# Patient Record
Sex: Female | Born: 1965 | Race: White | Hispanic: No | Marital: Married | Smoking: Never smoker
Health system: Southern US, Community
[De-identification: ages and names within clinical notes are randomized; demographics above are authoritative.]

## PROBLEM LIST (undated history)

## (undated) DIAGNOSIS — K219 Gastro-esophageal reflux disease without esophagitis: Secondary | ICD-10-CM

## (undated) DIAGNOSIS — F32A Depression, unspecified: Secondary | ICD-10-CM

## (undated) DIAGNOSIS — F419 Anxiety disorder, unspecified: Secondary | ICD-10-CM

## (undated) DIAGNOSIS — F329 Major depressive disorder, single episode, unspecified: Secondary | ICD-10-CM

## (undated) HISTORY — PX: TUBAL LIGATION: SHX77

---

## 1998-02-10 ENCOUNTER — Inpatient Hospital Stay (HOSPITAL_COMMUNITY): Admission: AD | Admit: 1998-02-10 | Discharge: 1998-02-10 | Payer: Self-pay | Admitting: *Deleted

## 1998-06-07 ENCOUNTER — Other Ambulatory Visit: Admission: RE | Admit: 1998-06-07 | Discharge: 1998-06-07 | Payer: Self-pay | Admitting: *Deleted

## 1998-10-14 ENCOUNTER — Ambulatory Visit (HOSPITAL_COMMUNITY): Admission: RE | Admit: 1998-10-14 | Discharge: 1998-10-14 | Payer: Self-pay | Admitting: Obstetrics and Gynecology

## 1998-11-03 ENCOUNTER — Ambulatory Visit (HOSPITAL_COMMUNITY): Admission: RE | Admit: 1998-11-03 | Discharge: 1998-11-03 | Payer: Self-pay | Admitting: Obstetrics & Gynecology

## 1998-12-30 ENCOUNTER — Inpatient Hospital Stay (HOSPITAL_COMMUNITY): Admission: AD | Admit: 1998-12-30 | Discharge: 1999-01-01 | Payer: Self-pay | Admitting: *Deleted

## 1999-02-04 ENCOUNTER — Other Ambulatory Visit: Admission: RE | Admit: 1999-02-04 | Discharge: 1999-02-04 | Payer: Self-pay | Admitting: *Deleted

## 1999-12-12 ENCOUNTER — Other Ambulatory Visit: Admission: RE | Admit: 1999-12-12 | Discharge: 1999-12-12 | Payer: Self-pay | Admitting: *Deleted

## 2000-04-04 ENCOUNTER — Ambulatory Visit (HOSPITAL_COMMUNITY): Admission: RE | Admit: 2000-04-04 | Discharge: 2000-04-04 | Payer: Self-pay | Admitting: *Deleted

## 2000-05-16 ENCOUNTER — Observation Stay (HOSPITAL_COMMUNITY): Admission: AD | Admit: 2000-05-16 | Discharge: 2000-05-16 | Payer: Self-pay | Admitting: *Deleted

## 2000-06-07 ENCOUNTER — Inpatient Hospital Stay (HOSPITAL_COMMUNITY): Admission: AD | Admit: 2000-06-07 | Discharge: 2000-06-09 | Payer: Self-pay | Admitting: Obstetrics and Gynecology

## 2000-07-16 ENCOUNTER — Other Ambulatory Visit: Admission: RE | Admit: 2000-07-16 | Discharge: 2000-07-16 | Payer: Self-pay | Admitting: *Deleted

## 2000-11-29 ENCOUNTER — Ambulatory Visit (HOSPITAL_COMMUNITY): Admission: RE | Admit: 2000-11-29 | Discharge: 2000-11-29 | Payer: Self-pay | Admitting: Obstetrics and Gynecology

## 2001-10-17 ENCOUNTER — Other Ambulatory Visit: Admission: RE | Admit: 2001-10-17 | Discharge: 2001-10-17 | Payer: Self-pay | Admitting: Obstetrics and Gynecology

## 2002-11-05 ENCOUNTER — Other Ambulatory Visit: Admission: RE | Admit: 2002-11-05 | Discharge: 2002-11-05 | Payer: Self-pay | Admitting: Obstetrics and Gynecology

## 2003-12-31 ENCOUNTER — Other Ambulatory Visit: Admission: RE | Admit: 2003-12-31 | Discharge: 2003-12-31 | Payer: Self-pay | Admitting: Obstetrics and Gynecology

## 2005-01-19 ENCOUNTER — Other Ambulatory Visit: Admission: RE | Admit: 2005-01-19 | Discharge: 2005-01-19 | Payer: Self-pay | Admitting: Obstetrics and Gynecology

## 2005-12-14 ENCOUNTER — Other Ambulatory Visit: Admission: RE | Admit: 2005-12-14 | Discharge: 2005-12-14 | Payer: Self-pay | Admitting: Obstetrics and Gynecology

## 2009-09-18 HISTORY — PX: COLONOSCOPY: SHX174

## 2011-05-29 ENCOUNTER — Encounter (HOSPITAL_COMMUNITY): Payer: Self-pay | Admitting: *Deleted

## 2011-06-01 NOTE — H&P (Signed)
NAME:  Debra Coleman, WERNLI NO.:  [id number]  MEDICAL RECORD NO.:  [id number]  LOCATION:                                 FACILITY:  PHYSICIAN:  Juluis Mire, M.D.   DATE OF BIRTH:  [birth year]  DATE OF ADMISSION: DATE OF DISCHARGE:                             HISTORY & PHYSICAL   The patient is a 45 year old, gravida 3, para 2, abortus 1 female who presents for hysteroscopic resection of an endometrial polyp as well as a midurethral sling with associated cystoscopy.  In relation to present admission, the patient is postmenopausal.  She presented complaining of postmenopausal bleeding.  She did undergo saline infusion ultrasound. Evaluation did reveal a fairly large endometrial polyp.  In view of this, we are going to proceed with hysteroscopic resection of the polyp.  She also complained of stress urinary incontinence and underwent urodynamic testing.  She did leak urine.  She had normal leak-point pressures.  She had normal urethral pressure profiles.  There were no signs of overactive bladder.  She did not have an obstructive voiding pattern.  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  ALLERGIES:  In terms of allergies, no known drug allergies.  MEDICATIONS:  She is on: 1. Lamictal 200 mg a day. 2. Wellbutrin 150 mg a day. 3. Xanax 0.5 mg daily. 4. Valtrex 500 mg daily. 5. On supplements.  PAST MEDICAL HISTORY:  She does have a history of depression, under active management, history of abnormal cervical cytology, history of interstitial cystitis, and irritable bowel syndrome.  PAST SURGICAL HISTORY:  She has had 2 vaginal deliveries and 1 previous laparoscopic bilateral tubal ligation.  FAMILY HISTORY:  The patient is adopted.  SOCIAL HISTORY:  Limited alcohol.  No tobacco use.  REVIEW OF SYSTEMS:   Noncontributory.  PHYSICAL EXAMINATION:  VITAL SIGNS:  The patient is afebrile, stable vital signs. HEENT:  The patient is normocephalic.  Pupils are equal, round, and reactive to light and accommodation.  Extraocular movements are intact. Sclerae and conjunctivae are clear.  Oropharynx is clear. NECK:  Without thyromegaly. BREASTS:  Not examined. LUNGS:  Clear. CARDIOVASCULAR:  Regular rhythm and rate.  There are no murmurs or gallops. ABDOMEN:  Benign.  No mass, organomegaly, or tenderness. PELVIC:  Normal external genitalia.  Vaginal mucosa is clear.  Mild cystourethrocele.  Cervix is unremarkable.  Uterus is normal size, shape, and contour.  Adnexa are free of masses or tenderness. EXTREMITIES:  Trace edema. NEUROLOGIC:  Grossly within normal limits.  IMPRESSION: 1. Postmenopausal bleeding with endometrial polyp. 2. 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  PLAN OF MANAGEMENT:  The patient will undergo hysteroscopy with D and C, we will be using the resectoscope to resect the polyp.  The risks of this procedure have been discussed including the risk of infection.  The risks of hemorrhage could require transfusion with the risks of AIDS or hepatitis.  Excessive bleeding could require hysterectomy.  There is a risk of perforation leading to injury to adjacent organs such as bowel that could require further exploratory surgery.  Risk of deep venous  thrombosis and pulmonary embolus.  For stress incontinence, we will be doing a midurethral sling with a transobturator approach.  Cystoscopy will be performed.  Success rates for this of 80% are quoted.  There is the potential risk of overtightening that could lead to an obstructive voiding pattern.  This could require a second procedure and we have to loosen the sling with the possible return of incontinence.  There is the risk of mesh erosion or rejection requiring further surgical management, erosion can  include the urethra or the bladder.  There is the risk of obturator nerve injury that could lead to chronic leg pain and weakness.  However, with our approach, that is highly unlikely.  There is the risk of development of overactive bladder.  This can lead to the requirement for medications to reduce the leakage associated with this.  Again, the complications include the risk of infection.  The risk of hemorrhage that could require transfusion and the risks of AIDS or hepatitis.  Risk of injury to adjacent organs requiring further exploratory surgery.  Risk of deep venous thrombosis or pulmonary embolus.  Mesh erosions also could lead to painful intercourse.  The patient expressed understanding the indications, risks, and alternatives.     Juluis Mire, M.D.     JSM/MEDQ  D:  06/01/2011  T:  06/01/2011  Job:  [id number]

## 2011-06-01 NOTE — H&P (Signed)
  Patient name  Debra Coleman DICTATION# [id number] CSN# [id number]  Juluis Mire, MD 06/01/2011 10:19 AM

## 2011-06-07 ENCOUNTER — Encounter (HOSPITAL_COMMUNITY): Payer: Self-pay | Admitting: Anesthesiology

## 2011-06-07 ENCOUNTER — Other Ambulatory Visit: Payer: Self-pay | Admitting: Obstetrics and Gynecology

## 2011-06-07 ENCOUNTER — Ambulatory Visit (HOSPITAL_COMMUNITY)
Admission: RE | Admit: 2011-06-07 | Discharge: 2011-06-07 | Disposition: A | Discharge status: Home / Self Care | Payer: Commercial Managed Care - PPO | Source: Ambulatory Visit | Attending: Obstetrics and Gynecology | Admitting: Obstetrics and Gynecology

## 2011-06-07 ENCOUNTER — Ambulatory Visit (HOSPITAL_COMMUNITY): Payer: Commercial Managed Care - PPO | Admitting: Anesthesiology

## 2011-06-07 ENCOUNTER — Encounter (HOSPITAL_COMMUNITY)
Admission: RE | Disposition: A | Discharge status: Home / Self Care | Payer: Self-pay | Source: Ambulatory Visit | Attending: Obstetrics and Gynecology

## 2011-06-07 DIAGNOSIS — N393 Stress incontinence (female) (male): Secondary | ICD-10-CM | POA: Insufficient documentation

## 2011-06-07 DIAGNOSIS — N3641 Hypermobility of urethra: Secondary | ICD-10-CM | POA: Insufficient documentation

## 2011-06-07 DIAGNOSIS — N840 Polyp of corpus uteri: Secondary | ICD-10-CM | POA: Insufficient documentation

## 2011-06-07 DIAGNOSIS — R32 Unspecified urinary incontinence: Secondary | ICD-10-CM

## 2011-06-07 HISTORY — DX: Anxiety disorder, unspecified: F41.9

## 2011-06-07 HISTORY — DX: Major depressive disorder, single episode, unspecified: F32.9

## 2011-06-07 HISTORY — PX: BLADDER SUSPENSION: SHX72

## 2011-06-07 HISTORY — DX: Gastro-esophageal reflux disease without esophagitis: K21.9

## 2011-06-07 HISTORY — DX: Depression, unspecified: F32.A

## 2011-06-07 LAB — CBC
HCT: 40.2 % (ref 36.0–46.0)
Hemoglobin: 13.7 g/dL (ref 12.0–15.0)
MCHC: 34.1 g/dL (ref 30.0–36.0)
WBC: 5.1 10*3/uL (ref 4.0–10.5)

## 2011-06-07 SURGERY — DILATATION & CURETTAGE/HYSTEROSCOPY WITH RESECTOCOPE
Anesthesia: Choice

## 2011-06-07 MED ORDER — PROPOFOL 10 MG/ML IV EMUL
INTRAVENOUS | Status: DC | PRN
  Administered 2011-06-07: 200 mg via INTRAVENOUS

## 2011-06-07 MED ORDER — CEFAZOLIN SODIUM 1-5 GM-% IV SOLN
INTRAVENOUS | Status: AC
  Filled 2011-06-07: qty 50

## 2011-06-07 MED ORDER — FENTANYL CITRATE 0.05 MG/ML IJ SOLN: 25.0000 ug | INTRAMUSCULAR | Status: DC | PRN

## 2011-06-07 MED ORDER — OXYCODONE-ACETAMINOPHEN 5-325 MG PO TABS
1.0000 | ORAL_TABLET | Freq: Once | ORAL | Status: AC
  Administered 2011-06-07: 1 via ORAL

## 2011-06-07 MED ORDER — PROPOFOL 10 MG/ML IV EMUL
INTRAVENOUS | Status: AC
  Filled 2011-06-07: qty 20

## 2011-06-07 MED ORDER — OXYCODONE-ACETAMINOPHEN 5-500 MG PO CAPS: 1.0000 | ORAL_CAPSULE | ORAL | Status: AC | PRN

## 2011-06-07 MED ORDER — KETOROLAC TROMETHAMINE 30 MG/ML IJ SOLN
INTRAMUSCULAR | Status: AC
  Filled 2011-06-07: qty 1

## 2011-06-07 MED ORDER — DEXAMETHASONE SODIUM PHOSPHATE 10 MG/ML IJ SOLN
INTRAMUSCULAR | Status: AC
  Filled 2011-06-07: qty 1

## 2011-06-07 MED ORDER — ALBUTEROL SULFATE HFA 108 (90 BASE) MCG/ACT IN AERS
2.0000 | INHALATION_SPRAY | Freq: Once | RESPIRATORY_TRACT | Status: AC
  Administered 2011-06-07: 2 via RESPIRATORY_TRACT

## 2011-06-07 MED ORDER — LIDOCAINE HCL (CARDIAC) 20 MG/ML IV SOLN
INTRAVENOUS | Status: AC
  Filled 2011-06-07: qty 5

## 2011-06-07 MED ORDER — ONDANSETRON HCL 4 MG/2ML IJ SOLN
INTRAMUSCULAR | Status: AC
  Filled 2011-06-07: qty 2

## 2011-06-07 MED ORDER — INDIGOTINDISULFONATE SODIUM 8 MG/ML IJ SOLN
INTRAMUSCULAR | Status: AC
  Filled 2011-06-07: qty 5

## 2011-06-07 MED ORDER — ALBUTEROL SULFATE HFA 108 (90 BASE) MCG/ACT IN AERS
INHALATION_SPRAY | RESPIRATORY_TRACT | Status: AC
  Administered 2011-06-07: 2 via RESPIRATORY_TRACT
  Filled 2011-06-07: qty 6.7

## 2011-06-07 MED ORDER — KETOROLAC TROMETHAMINE 30 MG/ML IJ SOLN
INTRAMUSCULAR | Status: DC | PRN
  Administered 2011-06-07: 30 mg via INTRAVENOUS

## 2011-06-07 MED ORDER — LIDOCAINE HCL (CARDIAC) 20 MG/ML IV SOLN
INTRAVENOUS | Status: DC | PRN
  Administered 2011-06-07: 40 mg via INTRAVENOUS

## 2011-06-07 MED ORDER — FENTANYL CITRATE 0.05 MG/ML IJ SOLN
INTRAMUSCULAR | Status: DC | PRN
  Administered 2011-06-07: 100 ug via INTRAVENOUS

## 2011-06-07 MED ORDER — CEFAZOLIN SODIUM 1-5 GM-% IV SOLN
1.0000 g | INTRAVENOUS | Status: AC
  Administered 2011-06-07: 1 g via INTRAVENOUS

## 2011-06-07 MED ORDER — MIDAZOLAM HCL 2 MG/2ML IJ SOLN
INTRAMUSCULAR | Status: AC
  Filled 2011-06-07: qty 2

## 2011-06-07 MED ORDER — INDIGOTINDISULFONATE SODIUM 8 MG/ML IJ SOLN
INTRAMUSCULAR | Status: DC | PRN
  Administered 2011-06-07: 5 mL via INTRAVENOUS

## 2011-06-07 MED ORDER — OXYCODONE-ACETAMINOPHEN 5-325 MG PO TABS
ORAL_TABLET | ORAL | Status: AC
  Administered 2011-06-07: 1 via ORAL
  Filled 2011-06-07: qty 1

## 2011-06-07 MED ORDER — LACTATED RINGERS IV SOLN
INTRAVENOUS | Status: DC
  Administered 2011-06-07: 125 mL/h via INTRAVENOUS
  Administered 2011-06-07: 11:00:00 via INTRAVENOUS

## 2011-06-07 MED ORDER — LIDOCAINE-EPINEPHRINE (PF) 1 %-1:200000 IJ SOLN
INTRAMUSCULAR | Status: DC | PRN
  Administered 2011-06-07: 23 mL

## 2011-06-07 MED ORDER — MIDAZOLAM HCL 5 MG/5ML IJ SOLN
INTRAMUSCULAR | Status: DC | PRN
  Administered 2011-06-07: 2 mg via INTRAVENOUS

## 2011-06-07 MED ORDER — DEXAMETHASONE SODIUM PHOSPHATE 10 MG/ML IJ SOLN
INTRAMUSCULAR | Status: DC | PRN
  Administered 2011-06-07: 10 mg via INTRAVENOUS

## 2011-06-07 MED ORDER — FENTANYL CITRATE 0.05 MG/ML IJ SOLN
INTRAMUSCULAR | Status: AC
  Filled 2011-06-07: qty 5

## 2011-06-07 MED ORDER — GLYCINE 1.5 % IR SOLN
Status: DC | PRN
  Administered 2011-06-07: 3000 mL

## 2011-06-07 MED ORDER — ONDANSETRON HCL 4 MG/2ML IJ SOLN
INTRAMUSCULAR | Status: DC | PRN
  Administered 2011-06-07: 4 mg via INTRAVENOUS

## 2011-06-07 SURGICAL SUPPLY — 29 items
BANDAGE GAUZE ELAST BULKY 4 IN (GAUZE/BANDAGES/DRESSINGS) IMPLANT
BLADE SURG 15 STRL LF C SS BP (BLADE) ×1 IMPLANT
BLADE SURG 15 STRL SS (BLADE) ×2
CANISTER SUCTION 2500CC (MISCELLANEOUS) ×2 IMPLANT
CATH ROBINSON RED A/P 16FR (CATHETERS) ×2 IMPLANT
CLOTH BEACON ORANGE TIMEOUT ST (SAFETY) ×2 IMPLANT
CONTAINER PREFILL 10% NBF 60ML (FORM) ×4 IMPLANT
DERMABOND ADVANCED (GAUZE/BANDAGES/DRESSINGS) ×2 IMPLANT
DRAPE CAMERA CLOSED 9X96 (DRAPES) ×2 IMPLANT
DRAPE HYSTEROSCOPY (DRAPE) ×2 IMPLANT
DRAPE UTILITY XL STRL (DRAPES) ×4 IMPLANT
ELECT REM PT RETURN 9FT ADLT (ELECTROSURGICAL) ×2
ELECTRODE REM PT RTRN 9FT ADLT (ELECTROSURGICAL) ×1 IMPLANT
GLOVE BIO SURGEON STRL SZ7 (GLOVE) ×4 IMPLANT
GOWN PREVENTION PLUS LG XLONG (DISPOSABLE) ×2 IMPLANT
GOWN STRL REIN XL XLG (GOWN DISPOSABLE) ×2 IMPLANT
LOOP ANGLED CUTTING 22FR (CUTTING LOOP) ×1 IMPLANT
NDL SPNL 20GX3.5 QUINCKE YW (NEEDLE) ×1 IMPLANT
NEEDLE SPNL 20GX3.5 QUINCKE YW (NEEDLE) ×2 IMPLANT
NS IRRIG 1000ML POUR BTL (IV SOLUTION) ×2 IMPLANT
PACK HYSTEROSCOPY LF (CUSTOM PROCEDURE TRAY) ×2 IMPLANT
PACK VAGINAL WOMENS (CUSTOM PROCEDURE TRAY) ×2 IMPLANT
SET CYSTO W/LG BORE CLAMP LF (SET/KITS/TRAYS/PACK) ×2 IMPLANT
SLING HALO OBTRYX (Sling) ×1 IMPLANT
SLING LYNX SUPRAPUBIC BX (SLING) IMPLANT
SUT VIC AB 2-0 UR6 27 (SUTURE) ×4 IMPLANT
TOWEL OR 17X24 6PK STRL BLUE (TOWEL DISPOSABLE) ×4 IMPLANT
TRAY FOLEY CATH 14FR (SET/KITS/TRAYS/PACK) ×2 IMPLANT
WATER STERILE IRR 1000ML POUR (IV SOLUTION) ×2 IMPLANT

## 2011-06-07 NOTE — Op Note (Signed)
Debra Coleman, KIDNEY              ACCOUNT NO.:  [id number]  MEDICAL RECORD NO.:  [id number]  LOCATION:  [facility]                          FACILITY:  [facility]  PHYSICIAN:  Juluis Mire, M.D.   DATE OF BIRTH:  1966-07-16  DATE OF PROCEDURE:  06/07/2011 DATE OF DISCHARGE:  06/07/2011                              OPERATIVE REPORT   PREOPERATIVE DIAGNOSES:  Endometrial polyp.  Anatomical stress urinary incontinence due to urethral hypermobility.  PROCEDURES:  Hysteroscopy, resection of endometrial polyp.  Uterine curettings.  Mid urethral sling using a transobturator approach. Cystoscopy.  SURGEON:  Juluis Mire, MD  ANESTHESIA:  General.  ESTIMATED BLOOD LOSS:  200 mL.  PACKS/DRAINS:  None.  INTRAOPERATIVE BLOOD PLACEMENT:  None.  COMPLICATIONS:  None.  INDICATIONS:  Dictated in history and physical.  PROCEDURE IN DETAIL:  The patient was taken to the OR and placed in the supine position.  After satisfactory level of general anesthesia was obtained, the patient was placed in the dorsal lithotomy position using the Allen stirrups.  The perineum and vagina prepped out with Betadine. The patient was then draped as a sterile field.  Speculum was placed in the vaginal vault.  The cervix was grasped with single-tooth tenaculum. A paracervical block of 1% Xylocaine with epinephrine was then instituted.  Uterus sounded to 9 cm.  Cervix was serially dilated with 31 Pratt dilator.  Operative hysteroscope was introduced.  Intrauterine cavity was distended using glycerol.  Visualization revealed an anterior wall endometrial polyp in the lower uterine segment.  This was resected completely and was sent for pathological review.  Once the resection was completed, we obtained endometrial curettings.  Total deficit was 250 mL.  There was no active bleeding or signs of perforation.  At this point in time, the Foley was placed into the bladder and the bladder was drained.  The Foley was clamped  off with a [name].  Mid urethral area was identified, infiltrated with 1% Xylocaine with epinephrine.  Incision was made in the vaginal mucosa over the mid urethral area.  Using blunt and sharp dissection, we dissected out laterally on each side to the obturator foramen.  A point on the groin below the abductus longus muscle, lateral to the inferior pubic ramus, about the level of clitoris was identified and infiltrated with 1% Xylocaine.  An incision was made.  Using the halo transobturator approach, both needles were passed through the skin, through the obturator foramen, around the inferior pubic ramus, and out through the vaginal incision.  There was no evidence of buttonholing of the vaginal mucosa.  Cystoscopy was performed.  There was no evidence of injury to the bladder or urethra.  The patient had been given indigo carmine. Blue-tinged urine was noted to be spilling from both ureteral orifices. Cystoscope was then removed.  The polypropylene mesh was placed on each needle and brought out through the skin.  The plastic tab was clipped. We adjusted the mesh under the mid urethral area until it lay flat but we could put a [name] and rotate at 90 degrees, so it was still loose. The plastic arms were swept off as we held the [name] in place.  We  still had good placement of the sling.  The arms of the sling were trimmed to the level of the skin.  The vaginal incision was closed with a running locking suture of 2-0 Vicryl.  Skin was closed with Dermabond.  Foley was placed to straight drain.  The patient had some rectal bleeding.  We did a rectal exam.  It looked like she had some fissures or internal hemorrhoids anteriorly.  At this point in time, the patient was taken out of the dorsal lithotomy position.  Once alert and extubated, transferred to recovery room in good condition.  Sponge, instrument, and needle count was correct by the circulating nurse x2.     Juluis Mire,  M.D.     JSM/MEDQ  D:  06/07/2011  T:  06/07/2011  Job:  [id number]

## 2011-06-07 NOTE — Transfer of Care (Signed)
Immediate Anesthesia Transfer of Care Note  Patient: Debra Coleman  Procedure(s) Performed:  DILATATION & CURETTAGE/HYSTEROSCOPY WITH RESECTOSCOPE; TRANSVAGINAL TAPE (TVT) PROCEDURE - transobturator sling  Patient Location: PACU  Anesthesia Type: General  Level of Consciousness: awake, alert , oriented and patient cooperative  Airway & Oxygen Therapy: Patient Spontanous Breathing and Patient connected to nasal cannula oxygen  Post-op Assessment: Report given to PACU RN and Post -op Vital signs reviewed and stable  Post vital signs: Reviewed and stable  Complications: No apparent anesthesia complications

## 2011-06-07 NOTE — Progress Notes (Signed)
Bladder instilled with of irrigation pt tolerated well.  Pt up to rest room to attempt to void.

## 2011-06-07 NOTE — Progress Notes (Signed)
Pt continues to complain of not being able to take a deep breath.  Lungs clear bilaterally.  No signs of respiratory distress.  Dr. Sheral Apley notified he assessed pt at bedside new orders received will carry out and continue to monitor.

## 2011-06-07 NOTE — Progress Notes (Signed)
Pt states that she feels like she can't take a deep breath.  Pt states that she felt the same way prior to surgery.  Pt denies any feeling of need to cough.  No resp distress is noted.  Pt is able to speak complete sentences.  RN has encouraged pt to change position and to reassess pt.  No wheezing noted at this time.

## 2011-06-07 NOTE — Brief Op Note (Signed)
06/07/2011  11:33 AM  PATIENT:  Debra Coleman  45 y.o. female  PRE-OPERATIVE DIAGNOSIS:  polyp, stress urinary incontinence  POST-OPERATIVE DIAGNOSIS:  polyp, stress urinary incontinence  PROCEDURE:  Procedure(s): DILATATION & CURETTAGE/HYSTEROSCOPY WITH RESECTOSCOPE TRANSVAGINAL TAPE (TVT) PROCEDURE  SURGEON:  Surgeon(s): Alontae Chaloux S Amalia Edgecombe  PHYSICIAN ASSISTANT:   ASSISTANTS: none   ANESTHESIA:   general  OR FLUID I/O:  Total I/O In: 1505 [I.V.:1505] Out: 100 [Blood:100]  BLOOD ADMINISTERED:none  DRAINS: Urinary Catheter (Foley)   LOCAL MEDICATIONS USED:  XYLOCAINE 28 CC  SPECIMEN:  Source of Specimen:  polyp and curretting  DISPOSITION OF SPECIMEN:  PATHOLOGY  COUNTS:  YES  TOURNIQUET:  * No tourniquets in log *  DICTATION: .Other Dictation: Dictation Number [id number]  PLAN OF CARE: Discharge to home after PACU  PATIENT DISPOSITION:  PACU - hemodynamically stable.   Delay start of Pharmacological VTE agent (>24hrs) due to surgical blood loss or risk of bleeding:  yes

## 2011-06-07 NOTE — Anesthesia Preprocedure Evaluation (Addendum)
Anesthesia Evaluation  Name, MR# and DOB Patient awake  General Assessment Comment  Reviewed: Allergy & Precautions, H&P , NPO status , Patient's Chart, lab work & pertinent test results  Airway Mallampati: I TM Distance: >3 FB Neck ROM: full    Dental  (+) Teeth Intact   Pulmonary  clear to auscultation  pulmonary exam normalPulmonary Exam Normal breath sounds clear to auscultation none    Cardiovascular     Neuro/Psych    (+) Anxiety, Depression,  Negative Neurological ROS    GI/Hepatic/Renal   negative Liver ROS  negative Renal ROS   GERD Controlled     Endo/Other  Negative Endocrine ROS (+)      Abdominal Normal abdominal exam  (+)   Musculoskeletal   Hematology negative hematology ROS (+)   Peds  Reproductive/Obstetrics negative OB ROS    Anesthesia Other Findings             Anesthesia Physical Anesthesia Plan  ASA: II  Anesthesia Plan: General   Post-op Pain Management:    Induction:   Airway Management Planned: LMA  Additional Equipment:   Intra-op Plan:   Post-operative Plan:   Informed Consent: I have reviewed the patients History and Physical, chart, labs and discussed the procedure including the risks, benefits and alternatives for the proposed anesthesia with the patient or authorized representative who has indicated his/her understanding and acceptance.     Plan Discussed with: CRNA  Anesthesia Plan Comments:         Anesthesia Quick Evaluation

## 2011-06-07 NOTE — Anesthesia Postprocedure Evaluation (Signed)
Anesthesia Post Note  Patient: Debra Coleman  Procedure(s) Performed:  DILATATION & CURETTAGE/HYSTEROSCOPY WITH RESECTOSCOPE; TRANSVAGINAL TAPE (TVT) PROCEDURE - transobturator sling  Anesthesia type: GA  Patient location: PACU  Post pain: Pain level controlled  Post assessment: Post-op Vital signs reviewed  Last Vitals:  Filed Vitals:   06/07/11 1215  BP: 109/66  Pulse: 71  Temp:   Resp: 12    Post vital signs: Reviewed  Level of consciousness: sedated  Complications: No apparent anesthesia complications

## 2011-06-07 NOTE — H&P (Signed)
Hand P unchanged

## 2011-06-07 NOTE — Progress Notes (Signed)
  Patient name  Careli Luzader DICTATION#  [id number] CSN#  [id number]  Juluis Mire, MD 06/07/2011 11:36 AM

## 2011-06-15 ENCOUNTER — Encounter (HOSPITAL_COMMUNITY): Payer: Self-pay | Admitting: Obstetrics and Gynecology

## 2015-06-29 ENCOUNTER — Other Ambulatory Visit: Payer: Self-pay | Admitting: Nurse Practitioner

## 2015-06-29 DIAGNOSIS — N6002 Solitary cyst of left breast: Secondary | ICD-10-CM

## 2015-07-15 ENCOUNTER — Ambulatory Visit
Admission: RE | Admit: 2015-07-15 | Discharge: 2015-07-15 | Disposition: A | Discharge status: Home / Self Care | Payer: Commercial Managed Care - PPO | Source: Ambulatory Visit | Attending: Nurse Practitioner | Admitting: Nurse Practitioner

## 2015-07-15 ENCOUNTER — Other Ambulatory Visit: Payer: Self-pay

## 2015-07-15 ENCOUNTER — Other Ambulatory Visit: Payer: Self-pay | Admitting: Nurse Practitioner

## 2015-07-15 DIAGNOSIS — N6002 Solitary cyst of left breast: Secondary | ICD-10-CM

## 2015-07-15 DIAGNOSIS — N6320 Unspecified lump in the left breast, unspecified quadrant: Secondary | ICD-10-CM

## 2015-07-20 ENCOUNTER — Ambulatory Visit
Admission: RE | Admit: 2015-07-20 | Discharge: 2015-07-20 | Disposition: A | Discharge status: Home / Self Care | Payer: Commercial Managed Care - PPO | Source: Ambulatory Visit | Attending: Nurse Practitioner | Admitting: Nurse Practitioner

## 2015-07-20 ENCOUNTER — Other Ambulatory Visit: Payer: Self-pay | Admitting: Nurse Practitioner

## 2015-07-20 DIAGNOSIS — N6320 Unspecified lump in the left breast, unspecified quadrant: Secondary | ICD-10-CM

## 2015-07-20 HISTORY — PX: BREAST BIOPSY: SHX20

## 2016-11-22 IMAGING — US US LT BREAST BX
1 series · 13 of 16 positions shown · non-contrast
Comparison: Previous exam(s).

CLINICAL DATA: 49-year-old female presenting for evaluation of
palpable areas of concern in the left breast. The patient noticed a
tender palpable area in the lateral left breast. The patient's
physician noted an area of thickening in the superior left breast.

EXAM:
DIGITAL DIAGNOSTIC BILATERAL MAMMOGRAM WITH 3D TOMOSYNTHESIS AND CAD
LEFT BREAST ULTRASOUND

[Series 1: us left breast bx · 0.07mm/px · 13 of 16 slices shown]
[im 1/16]
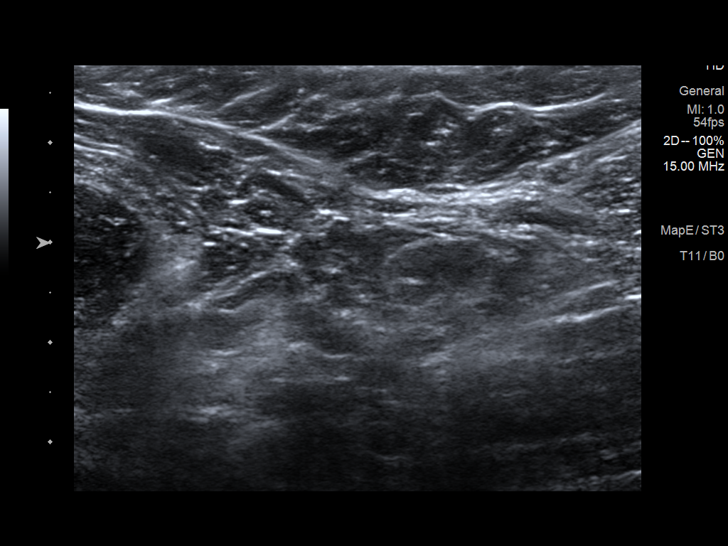
[im 2/16]
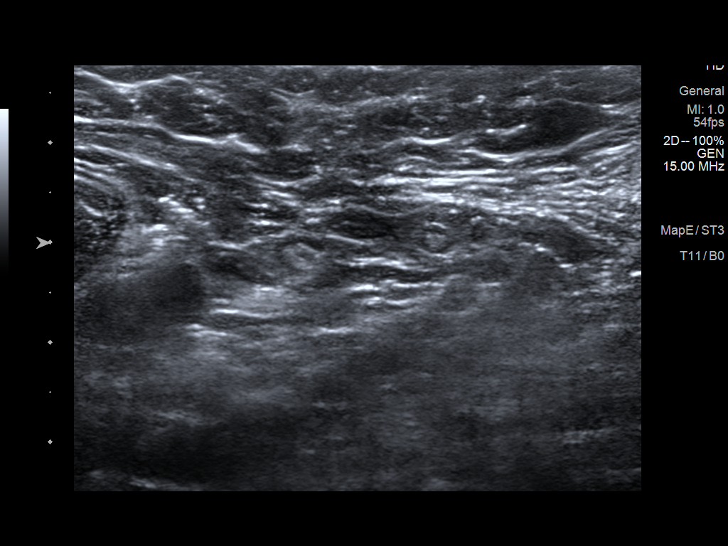
[im 4/16]
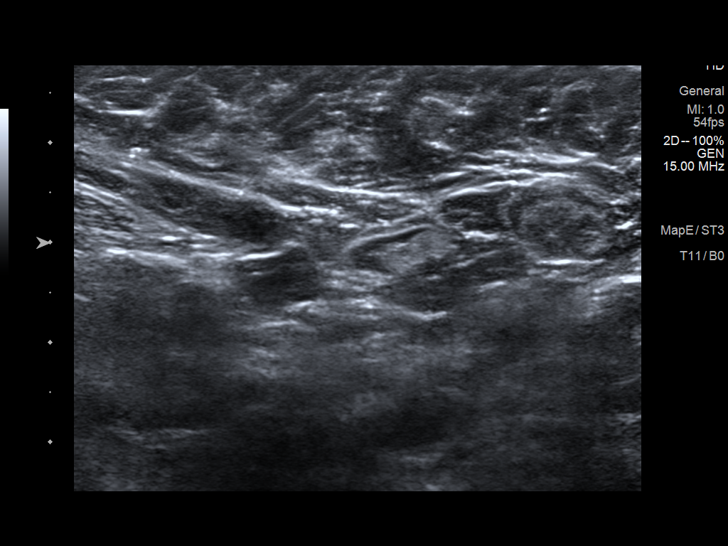
[im 5/16]
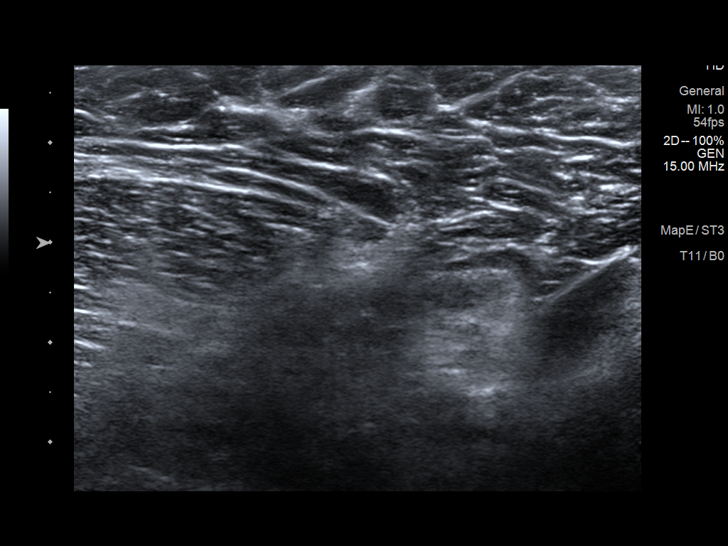
[im 6/16]
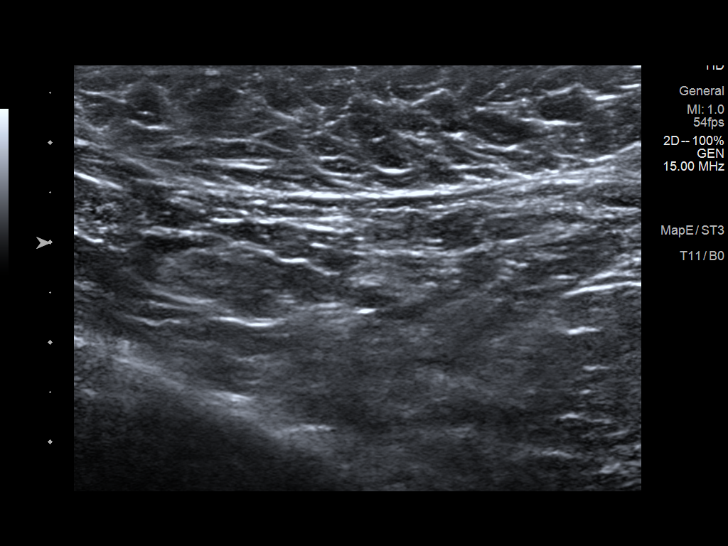
[im 7/16]
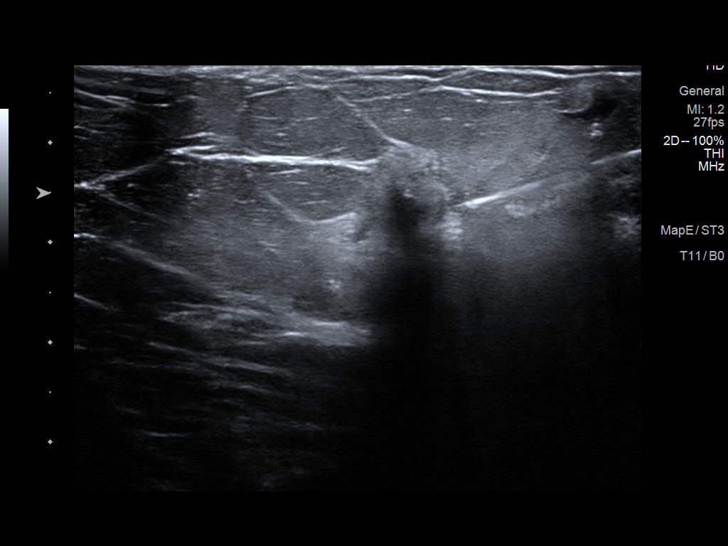
[im 9/16]
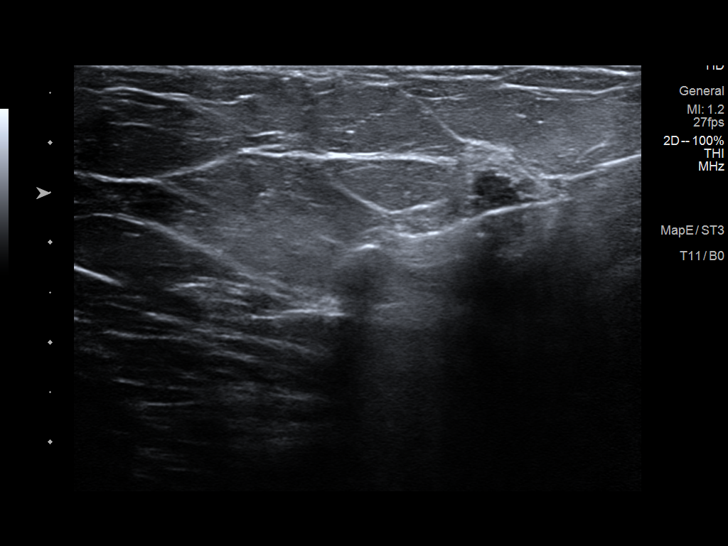
[im 10/16]
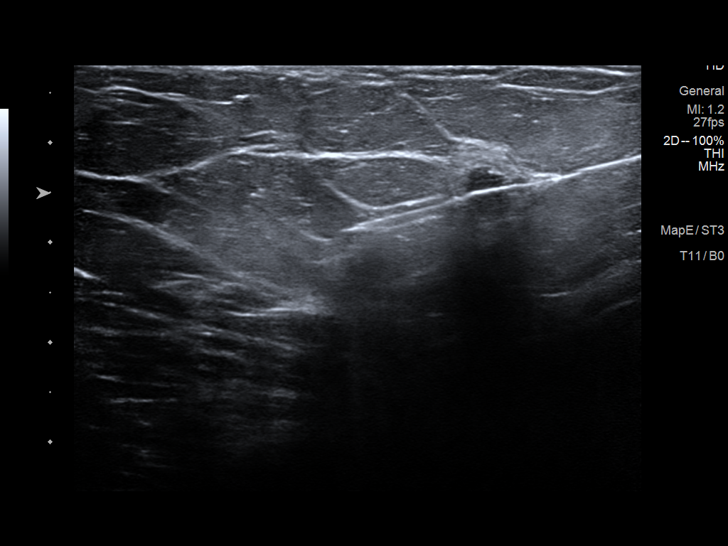
[im 11/16]
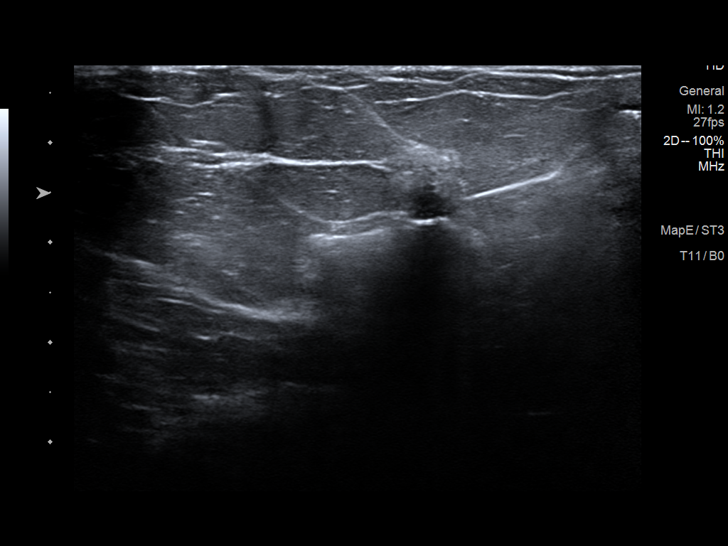
[im 12/16]
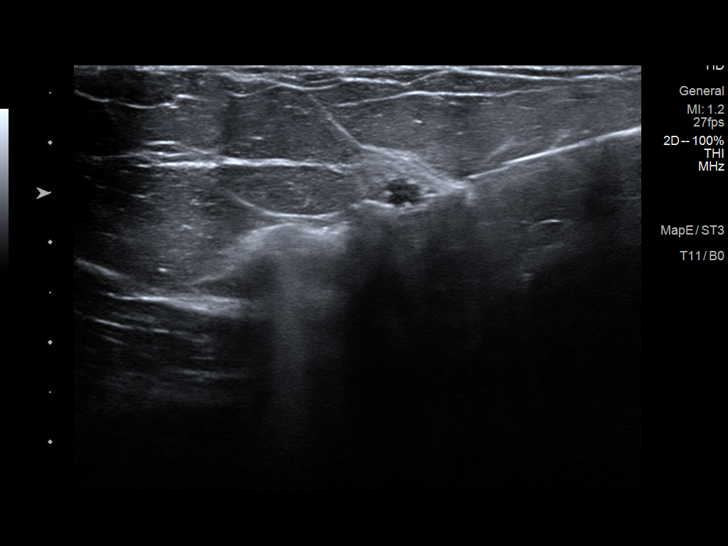
[im 13/16]
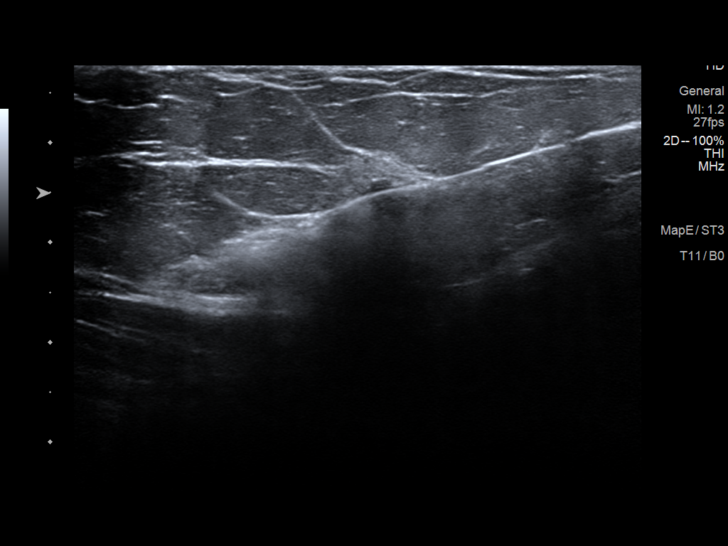
[im 15/16]
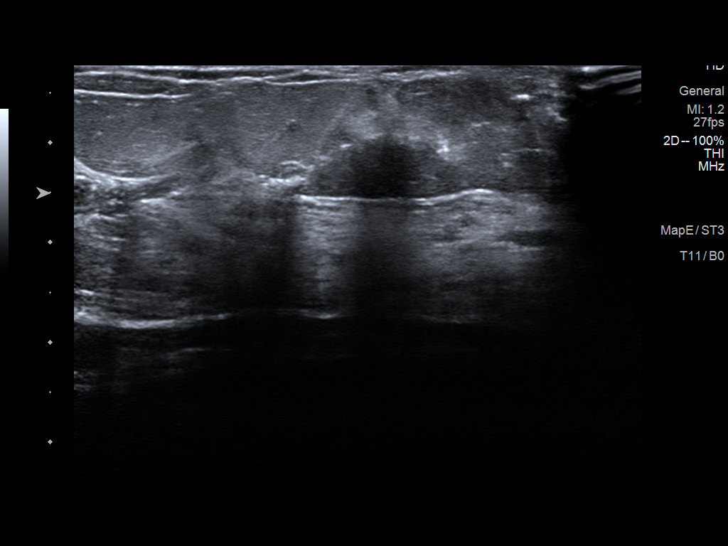
[im 16/16]
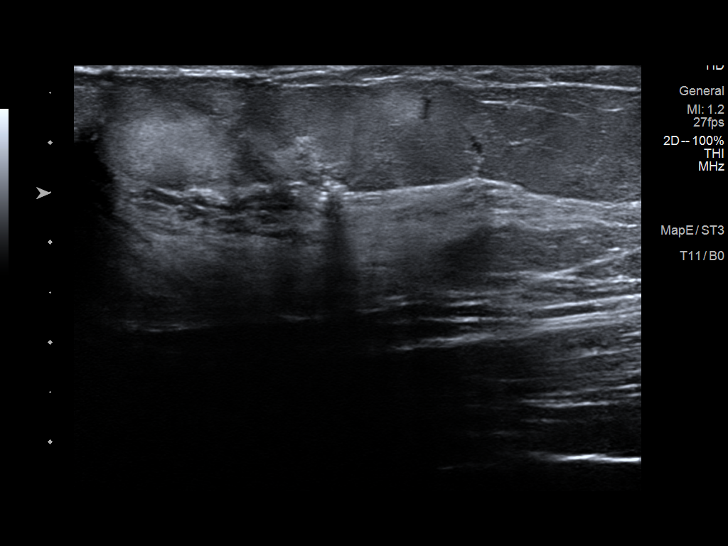

[13 of 16 positions shown; findings below may reference images not displayed]

ACR Breast Density Category c: The breast tissue is heterogeneously
dense, which may obscure small masses.
FINDINGS: A palpable marker has been placed on the lateral aspect of the left
breast. Dense breast tissue is seen deep to the palpable marker
without definite discrete mass. In the central left breast, middle
to anterior depth, there is a 0.9 cm irregular mass.

Mammographic images were processed with CAD.

Physical exam of the superior lateral left breast demonstrates no
definite discrete palpable masses. Dense fibroglandular tissue is
palpated.

Ultrasound targeted to the upper-outer quadrant of left breast
demonstrates scattered areas of fibrocystic change. In the left
breast at 12 o'clock, 1 cm from the nipple there is a hypoechoic
irregular mass with macrolobulations measuring 1.1 x 0.6 x 0.4 cm.
This corresponds with the mass identified on mammography.
IMPRESSION: 1. Indeterminate 1.1 cm irregular mass at 12 o'clock, 1 cm from the
nipple.

2. No mammographic or targeted sonographic correlate for the
palpable area in the lateral left breast.

RECOMMENDATION:
1. Ultrasound-guided biopsy is recommended for the 1.1 cm mass in
the left breast at 12 o'clock. This has been scheduled for
07/20/2015 at 9 a.m..

2. No mammographic or targeted sonographic correlate for the
palpable area of concern in the lateral left breast. Any further
workup of this palpable area should be based on clinical assessment.

I have discussed the findings and recommendations with the patient.
Results were also provided in writing at the conclusion of the
visit. If applicable, a reminder letter will be sent to the patient
regarding the next appointment.

BI-RADS CATEGORY  4: Suspicious.

## 2017-12-13 ENCOUNTER — Other Ambulatory Visit: Payer: Self-pay | Admitting: Radiology

## 2017-12-13 DIAGNOSIS — N644 Mastodynia: Secondary | ICD-10-CM

## 2017-12-18 ENCOUNTER — Other Ambulatory Visit: Payer: Commercial Managed Care - PPO

## 2017-12-25 ENCOUNTER — Ambulatory Visit
Admission: RE | Admit: 2017-12-25 | Discharge: 2017-12-25 | Disposition: A | Discharge status: Home / Self Care | Payer: PRIVATE HEALTH INSURANCE | Source: Ambulatory Visit | Attending: Radiology | Admitting: Radiology

## 2017-12-25 ENCOUNTER — Ambulatory Visit
Admission: RE | Admit: 2017-12-25 | Discharge: 2017-12-25 | Disposition: A | Discharge status: Home / Self Care | Payer: Commercial Managed Care - PPO | Source: Ambulatory Visit | Attending: Radiology | Admitting: Radiology

## 2017-12-25 DIAGNOSIS — N644 Mastodynia: Secondary | ICD-10-CM

## 2018-07-17 ENCOUNTER — Other Ambulatory Visit: Payer: Self-pay

## 2018-07-17 MED ORDER — LAMOTRIGINE 200 MG PO TABS: 200.0000 mg | ORAL_TABLET | Freq: Every day | ORAL | 1 refills | Status: DC

## 2018-09-24 ENCOUNTER — Telehealth: Payer: Self-pay

## 2018-09-24 MED ORDER — BUPROPION HCL ER (XL) 150 MG PO TB24: 150.0000 mg | ORAL_TABLET | Freq: Every day | ORAL | 0 refills | Status: DC

## 2018-09-24 NOTE — Telephone Encounter (Signed)
Refill request from [facility] for Buproprion XL 150 mg, Next OV 11/05/2018 Last seen 05/2018

## 2018-09-25 ENCOUNTER — Other Ambulatory Visit: Payer: Self-pay

## 2018-09-25 ENCOUNTER — Encounter: Payer: Self-pay | Admitting: Psychiatry

## 2018-09-25 ENCOUNTER — Ambulatory Visit (INDEPENDENT_AMBULATORY_CARE_PROVIDER_SITE_OTHER): Payer: No Typology Code available for payment source | Admitting: Psychiatry

## 2018-09-25 DIAGNOSIS — R7989 Other specified abnormal findings of blood chemistry: Secondary | ICD-10-CM

## 2018-09-25 DIAGNOSIS — F4001 Agoraphobia with panic disorder: Secondary | ICD-10-CM | POA: Diagnosis not present

## 2018-09-25 DIAGNOSIS — F5105 Insomnia due to other mental disorder: Secondary | ICD-10-CM

## 2018-09-25 DIAGNOSIS — F411 Generalized anxiety disorder: Secondary | ICD-10-CM | POA: Diagnosis not present

## 2018-09-25 DIAGNOSIS — F3181 Bipolar II disorder: Secondary | ICD-10-CM

## 2018-09-25 MED ORDER — ALPRAZOLAM 0.25 MG PO TABS: ORAL_TABLET | ORAL | 4 refills | Status: DC

## 2018-09-25 MED ORDER — ALPRAZOLAM 0.25 MG PO TABS: ORAL_TABLET | ORAL | 1 refills | Status: DC

## 2018-09-25 NOTE — Progress Notes (Signed)
Debra Coleman [id number] 1966/08/15 54 y.o.  Subjective:   Patient ID:  Debra Coleman is a 53 y.o. (DOB [birth year]) female.  Chief Complaint:  Chief Complaint  Patient presents with  . Anxiety    Increasing this past week    HPI  Last seen 05/21/18.  Today urgent appt. Debra Coleman presents to the office today for follow-up of mood disorder and worsening anxiety.  Went to ER [place] yesterday with panic.  Workup was normal.  Normally take the Xanax 0.25 HS.  Stress F dementia and mother stressed and sleep problems.  Gave M 5 Xanax and then patient ran out last Friday.  Also work is still stressful.  No Xanax on Friday and didn't sleep.  Sunday evening mother got Xanax from her PCP.  Patient took different manufacturer of Xanax 0.25 and couldn't sleep last 2 nights with EMA.  In 3 days got 12 hours sleep and real stressful work Monday.  Very sensitive to sleep deprivation.  Can't think clearly.  Last night had CP and feared MI.  Went to ER.  Missed work today.  Mild seasonal depression for 3-4 weeks with increased sleep need and down, less motivated and eating more.  Been consistent with other meds.  Is taking propranolol.   Review of Systems:  Review of Systems  Neurological: Negative for tremors and weakness.  Psychiatric/Behavioral: Positive for dysphoric mood and sleep disturbance. Negative for agitation, behavioral problems, confusion, decreased concentration, hallucinations, self-injury and suicidal ideas. The patient is nervous/anxious. The patient is not hyperactive.     Medications: I have reviewed the patient's current medications.  Current Outpatient Medications  Medication Sig Dispense Refill  . Acetylcysteine (NAC PO) Take 1 tablet by mouth daily.      Marland Kitchen ALPRAZolam (XANAX) 0.25 MG tablet Take 1/2 tablet by mouth at bedtime and 1 tablet every day as needed for anxiety 45 tablet 4  . B Complex-Folic Acid (B COMPLEX-VITAMIN B12 PO) Take 1 tablet by mouth daily.       Marland Kitchen buPROPion (WELLBUTRIN XL) 150 MG 24 hr tablet Take 1 tablet (150 mg total) by mouth daily. 90 tablet 0  . Cholecalciferol (VITAMIN D-3 PO) Take 2 tablets by mouth daily.     Marland Kitchen lamoTRIgine (LAMICTAL) 200 MG tablet Take 1 tablet (200 mg total) by mouth daily. 90 tablet 1  . propranolol (INDERAL) 20 MG tablet Take 40 mg by mouth daily.    . valACYclovir (VALTREX) 500 MG tablet Take 500 mg by mouth 2 (two) times daily.     No current facility-administered medications for this visit.     Medication Side Effects: None  Allergies: No Known Allergies  Past Medical History:  Diagnosis Date  . Anxiety   . Depression    takes wellbutrin, lamictal  . GERD (gastroesophageal reflux disease)    no meds    Family History  Adopted: Yes    Social History   Socioeconomic History  . Marital status: Married    Spouse name: Not on file  . Number of children: Not on file  . Years of education: Not on file  . Highest education level: Not on file  Occupational History  . Not on file  Social Needs  . Financial resource strain: Not on file  . Food insecurity:    Worry: Not on file    Inability: Not on file  . Transportation needs:    Medical: Not on file    Non-medical: Not on file  Tobacco Use  . Smoking status: Never Smoker  . Smokeless tobacco: Never Used  Substance and Sexual Activity  . Alcohol use: Yes  . Drug use: No  . Sexual activity: Yes  Lifestyle  . Physical activity:    Days per week: Not on file    Minutes per session: Not on file  . Stress: Not on file  Relationships  . Social connections:    Talks on phone: Not on file    Gets together: Not on file    Attends religious service: Not on file    Active member of club or organization: Not on file    Attends meetings of clubs or organizations: Not on file    Relationship status: Not on file  . Intimate partner violence:    Fear of current or ex partner: Not on file    Emotionally abused: Not on file     Physically abused: Not on file    Forced sexual activity: Not on file  Other Topics Concern  . Not on file  Social History Narrative  . Not on file    Past Medical History, Surgical history, Social history, and Family history were reviewed and updated as appropriate.   Please see review of systems for further details on the patient's review from today.   Objective:   Physical Exam:  LMP 02/26/2011   Physical Exam Constitutional:      General: She is not in acute distress.    Appearance: Normal appearance. She is well-developed.  Musculoskeletal:        General: No deformity.  Neurological:     Mental Status: She is alert and oriented to person, place, and time.     Motor: No tremor.     Coordination: Coordination normal.     Gait: Gait normal.  Psychiatric:        Attention and Perception: Attention normal.        Mood and Affect: Mood is anxious and depressed. Affect is not labile, blunt, angry or inappropriate.        Speech: Speech normal.        Behavior: Behavior normal.        Thought Content: Thought content normal. Thought content does not include homicidal or suicidal ideation. Thought content does not include homicidal or suicidal plan.        Cognition and Memory: Cognition normal.        Judgment: Judgment normal.     Comments: Insight intact. No auditory or visual hallucinations. No delusions.      Lab Review:  No results found for: NA, K, CL, CO2, GLUCOSE, BUN, CREATININE, CALCIUM, PROT, ALBUMIN, AST, ALT, ALKPHOS, BILITOT, GFRNONAA, GFRAA     Component Value Date/Time   WBC 5.1 06/07/2011 0918   RBC 4.22 06/07/2011 0918   HGB 13.7 06/07/2011 0918   HCT 40.2 06/07/2011 0918   PLT 173 06/07/2011 0918   MCV 95.3 06/07/2011 0918   MCH 32.5 06/07/2011 0918   MCHC 34.1 06/07/2011 0918   RDW 12.8 06/07/2011 0918    No results found for: POCLITH, LITHIUM   No results found for: PHENYTOIN, PHENOBARB, VALPROATE, CBMZ   .res Assessment: Plan:     Panic disorder with agoraphobia  Generalized anxiety disorder  Bipolar 2 disorder (HCC)  Low vitamin D level   We discussed the short-term risks associated with benzodiazepines including sedation and increased fall risk among others.  Discussed long-term side effect risk including dependence, potential withdrawal symptoms, and the  potential eventual dose-related risk of dementia. Take Xanax 0.375 mg HS for a few days then drop back to 0.25 when sleep pattern is better.  Get [place] the name of the effective and the ineffective generic manufacturer.  Take vitamin D increase to 5000 U daily through the winter off label for seasonal depression  Consider increasing the Wellbutrin later or other med changes.  Educated re Panic disorder.  This appt was 30 mins.  FU 2 mos  Meredith Staggers, MD, DFAPA   Please see After Visit Summary for patient specific instructions.  Future Appointments  Date Time Provider Department Center  11/05/2018  4:30 PM Cottle, Steva Ready., MD [facility] None    No orders of the defined types were placed in this encounter.     -------------------------------

## 2018-09-25 NOTE — Patient Instructions (Signed)
Get [place] the name of the effective and the ineffective generic manufacturer.

## 2018-11-05 ENCOUNTER — Ambulatory Visit: Payer: Self-pay | Admitting: Psychiatry

## 2018-12-31 ENCOUNTER — Other Ambulatory Visit: Payer: Self-pay

## 2018-12-31 MED ORDER — PROPRANOLOL HCL 20 MG PO TABS: 40.0000 mg | ORAL_TABLET | Freq: Every day | ORAL | 0 refills | Status: DC

## 2019-01-09 ENCOUNTER — Other Ambulatory Visit: Payer: Self-pay

## 2019-01-09 MED ORDER — LAMOTRIGINE 200 MG PO TABS: 200.0000 mg | ORAL_TABLET | Freq: Every day | ORAL | 1 refills | Status: DC

## 2019-02-19 ENCOUNTER — Other Ambulatory Visit: Payer: Self-pay | Admitting: Psychiatry

## 2019-02-20 NOTE — Telephone Encounter (Signed)
Not been seen since 09/2018 due back in Feb and cxl. Front office to call to set up appt.

## 2019-03-10 ENCOUNTER — Encounter: Payer: Self-pay | Admitting: Psychiatry

## 2019-03-10 ENCOUNTER — Other Ambulatory Visit: Payer: Self-pay

## 2019-03-10 ENCOUNTER — Ambulatory Visit (INDEPENDENT_AMBULATORY_CARE_PROVIDER_SITE_OTHER): Payer: No Typology Code available for payment source | Admitting: Psychiatry

## 2019-03-10 DIAGNOSIS — F5105 Insomnia due to other mental disorder: Secondary | ICD-10-CM

## 2019-03-10 DIAGNOSIS — F411 Generalized anxiety disorder: Secondary | ICD-10-CM

## 2019-03-10 DIAGNOSIS — R7989 Other specified abnormal findings of blood chemistry: Secondary | ICD-10-CM

## 2019-03-10 DIAGNOSIS — F4001 Agoraphobia with panic disorder: Secondary | ICD-10-CM | POA: Diagnosis not present

## 2019-03-10 DIAGNOSIS — F3181 Bipolar II disorder: Secondary | ICD-10-CM

## 2019-03-10 NOTE — Progress Notes (Signed)
Debra Coleman [id number] 1966-07-30 53 y.o.  Subjective:   Patient ID:  Debra PenmanDebra Leigh Gentle is a 53 y.o. (DOB 1966-07-30) female.  Chief Complaint:  Chief Complaint  Patient presents with  . Anxiety    med management  . Sleeping Problem  . Follow-up    depression    HPI   Debra PenmanDebra Leigh Balch presents to the office today for follow-up of mood disorder and worsening anxiety.  [facility].  Workup was normal.  Seen September 25, 2018.  She had been having some panic and insomnia and alprazolam was adjusted slightly.  It was suggested she take vitamin D through the winter for her history of seasonal depression.  No other med changes were made.  Been consistent with the meds.  A lot better.  Propranolol helps the palpitations and split the dosage am and pm with good results.  No panic now.  Patient reports stable mood and denies depressed or irritable moods.  Patient denies any recent difficulty with anxiety.  Patient denies difficulty with sleep initiation or maintenance. Sleep 8-9 hours straight through the night.  Denies appetite disturbance.  Patient reports that energy and motivation have been good.  Patient denies any difficulty with concentration.  Patient denies any suicidal ideation.  No sig seasonal depression.  Past Psychiatric Medication Trials: Propranolol 20 mg twice daily, Wellbutrin XL, lamotrigine 200, sertraline, duloxetine, paroxetine, venlafaxine worsened anxiety, Abilify, lithium weight gain, lamotrigine   Review of Systems:  Review of Systems  Neurological: Negative for tremors and weakness.  Psychiatric/Behavioral: Negative for agitation, behavioral problems, confusion, decreased concentration, dysphoric mood, hallucinations, self-injury, sleep disturbance and suicidal ideas. The patient is not hyperactive.     Medications: I have reviewed the patient's current medications.  Current Outpatient Medications  Medication Sig Dispense  Refill  . Acetylcysteine (NAC PO) Take 1 tablet by mouth daily.      Marland Kitchen. ALPRAZolam (XANAX) 0.25 MG tablet TAKE 1/2 TABLET BY MOUTH EVERY NIGHT AT BEDTIME AND 1 TABLET BY MOUTH EVERY DAY AS NEEDED FOR ANXIETY 45 tablet 0  . B Complex-Folic Acid (B COMPLEX-VITAMIN B12 PO) Take 1 tablet by mouth daily.      Marland Kitchen. buPROPion (WELLBUTRIN XL) 150 MG 24 hr tablet Take 1 tablet (150 mg total) by mouth daily. 90 tablet 0  . Cholecalciferol (VITAMIN D-3 PO) Take 2 tablets by mouth daily.     Marland Kitchen. lamoTRIgine (LAMICTAL) 200 MG tablet Take 1 tablet (200 mg total) by mouth daily. 90 tablet 1  . propranolol (INDERAL) 20 MG tablet Take 2 tablets (40 mg total) by mouth daily. 180 tablet 0  . valACYclovir (VALTREX) 500 MG tablet Take 500 mg by mouth 2 (two) times daily.     No current facility-administered medications for this visit.     Medication Side Effects: None  Allergies: No Known Allergies  Past Medical History:  Diagnosis Date  . Anxiety   . Depression    takes wellbutrin, lamictal  . GERD (gastroesophageal reflux disease)    no meds    Family History  Adopted: Yes    Social History   Socioeconomic History  . Marital status: Married    Spouse name: Not on file  . Number of children: Not on file  . Years of education: Not on file  . Highest education level: Not on file  Occupational History  . Not on file  Social Needs  . Financial resource strain: Not on file  .  Food insecurity    Worry: Not on file    Inability: Not on file  . Transportation needs    Medical: Not on file    Non-medical: Not on file  Tobacco Use  . Smoking status: Never Smoker  . Smokeless tobacco: Never Used  Substance and Sexual Activity  . Alcohol use: Yes  . Drug use: No  . Sexual activity: Yes  Lifestyle  . Physical activity    Days per week: Not on file    Minutes per session: Not on file  . Stress: Not on file  Relationships  . Social Herbalist on phone: Not on file    Gets together:  Not on file    Attends religious service: Not on file    Active member of club or organization: Not on file    Attends meetings of clubs or organizations: Not on file    Relationship status: Not on file  . Intimate partner violence    Fear of current or ex partner: Not on file    Emotionally abused: Not on file    Physically abused: Not on file    Forced sexual activity: Not on file  Other Topics Concern  . Not on file  Social History Narrative  . Not on file    Past Medical History, Surgical history, Social history, and Family history were reviewed and updated as appropriate.   Please see review of systems for further details on the patient's review from today.   Objective:   Physical Exam:  LMP 02/26/2011   Physical Exam Constitutional:      General: She is not in acute distress.    Appearance: Normal appearance. She is well-developed.  Musculoskeletal:        General: No deformity.  Neurological:     Mental Status: She is alert and oriented to person, place, and time.     Motor: No tremor.     Coordination: Coordination normal.     Gait: Gait normal.  Psychiatric:        Attention and Perception: Attention normal. She does not perceive auditory hallucinations.        Mood and Affect: Mood is not anxious or depressed. Affect is not labile, blunt, angry or inappropriate.        Speech: Speech normal.        Behavior: Behavior normal.        Thought Content: Thought content normal. Thought content does not include homicidal or suicidal ideation. Thought content does not include homicidal or suicidal plan.        Cognition and Memory: Cognition normal.        Judgment: Judgment normal.     Comments: Insight intact. No auditory or visual hallucinations. No delusions.      Lab Review:  No results found for: NA, K, CL, CO2, GLUCOSE, BUN, CREATININE, CALCIUM, PROT, ALBUMIN, AST, ALT, ALKPHOS, BILITOT, GFRNONAA, GFRAA     Component Value Date/Time   WBC 5.1 06/07/2011  0918   RBC 4.22 06/07/2011 0918   HGB 13.7 06/07/2011 0918   HCT 40.2 06/07/2011 0918   PLT 173 06/07/2011 0918   MCV 95.3 06/07/2011 0918   MCH 32.5 06/07/2011 0918   MCHC 34.1 06/07/2011 0918   RDW 12.8 06/07/2011 0918    No results found for: POCLITH, LITHIUM   No results found for: PHENYTOIN, PHENOBARB, VALPROATE, CBMZ   Normal recent vitamin D levels  .res Assessment: Plan:    [place]  was seen today for anxiety, sleeping problem and follow-up.  Diagnoses and all orders for this visit:  Panic disorder with agoraphobia  Insomnia due to mental condition  Generalized anxiety disorder  Bipolar 2 disorder (HCC)  Low vitamin D level   She had a relapse of panic disorder back in the winter and was seen emergently.  Minor adjustments got her through that crisis and she has been fine since then.  She is not having any significant depression either.  Her insomnia is managed.  The propranolol is helpful for the anxiety and palpitations.  Overall she is done reasonably well on this medication combination since 2010.  She has a long psychiatric history dating back to [id number].  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  We discussed the short-term risks associated with benzodiazepines including sedation and increased fall risk among others.  Discussed long-term side effect risk including dependence, potential withdrawal symptoms, and the potential eventual dose-related risk of dementia. Use the lowest effective dose necessary to maintain sleep and guard against panic.  She is managing on a very low dosage of Xanax.  Take vitamin D increase to 5000 U daily through the winter off label for seasonal depression  No med changes.  Educated re Panic disorder.  Vitamin D levels now normal.  15 minutes today  FU 6 mos  Meredith Staggersarey Cottle, MD, DFAPA   Please see After Visit Summary for  patient specific instructions.  No future appointments.  No orders of the defined types were placed in this encounter.     -------------------------------

## 2019-03-24 ENCOUNTER — Other Ambulatory Visit: Payer: Self-pay | Admitting: Psychiatry

## 2019-03-31 ENCOUNTER — Other Ambulatory Visit: Payer: Self-pay | Admitting: Psychiatry

## 2019-04-04 ENCOUNTER — Other Ambulatory Visit: Payer: Self-pay

## 2019-04-04 MED ORDER — ALPRAZOLAM 0.25 MG PO TABS: ORAL_TABLET | ORAL | 2 refills | Status: DC

## 2019-07-02 ENCOUNTER — Other Ambulatory Visit: Payer: Self-pay | Admitting: Psychiatry

## 2019-08-06 ENCOUNTER — Other Ambulatory Visit: Payer: Self-pay

## 2019-08-06 MED ORDER — ALPRAZOLAM 0.25 MG PO TABS: ORAL_TABLET | ORAL | 2 refills | Status: DC

## 2019-09-02 ENCOUNTER — Ambulatory Visit: Payer: No Typology Code available for payment source | Admitting: Psychiatry

## 2019-10-01 ENCOUNTER — Other Ambulatory Visit: Payer: Self-pay | Admitting: Psychiatry

## 2019-10-14 ENCOUNTER — Ambulatory Visit (INDEPENDENT_AMBULATORY_CARE_PROVIDER_SITE_OTHER): Payer: No Typology Code available for payment source | Admitting: Psychiatry

## 2019-10-14 ENCOUNTER — Encounter: Payer: Self-pay | Admitting: Psychiatry

## 2019-10-14 ENCOUNTER — Other Ambulatory Visit: Payer: Self-pay

## 2019-10-14 DIAGNOSIS — F4001 Agoraphobia with panic disorder: Secondary | ICD-10-CM

## 2019-10-14 DIAGNOSIS — F411 Generalized anxiety disorder: Secondary | ICD-10-CM | POA: Diagnosis not present

## 2019-10-14 DIAGNOSIS — R7989 Other specified abnormal findings of blood chemistry: Secondary | ICD-10-CM

## 2019-10-14 DIAGNOSIS — F5105 Insomnia due to other mental disorder: Secondary | ICD-10-CM | POA: Diagnosis not present

## 2019-10-14 DIAGNOSIS — F3181 Bipolar II disorder: Secondary | ICD-10-CM | POA: Diagnosis not present

## 2019-10-14 MED ORDER — BUPROPION HCL ER (XL) 150 MG PO TB24: 150.0000 mg | ORAL_TABLET | Freq: Every day | ORAL | 1 refills | Status: DC

## 2019-10-14 MED ORDER — ALPRAZOLAM 0.25 MG PO TABS: ORAL_TABLET | ORAL | 5 refills | Status: DC

## 2019-10-14 MED ORDER — LAMOTRIGINE 200 MG PO TABS: 200.0000 mg | ORAL_TABLET | Freq: Every day | ORAL | 1 refills | Status: DC

## 2019-10-14 MED ORDER — PROPRANOLOL HCL 20 MG PO TABS: 40.0000 mg | ORAL_TABLET | Freq: Every day | ORAL | 0 refills | Status: DC

## 2019-10-14 NOTE — Progress Notes (Signed)
Debra Coleman [id number] [birth year] 55 y.o.  Subjective:   Patient ID:  Debra Coleman is a 54 y.o. (DOB February 21, 1966) female.  Chief Complaint:  Chief Complaint  Patient presents with  . Follow-up    Medication Management and mood  . Other    Panic disorder with agoraphobia    HPI   Debra Coleman presents to the office today for follow-up of mood disorder and worsening anxiety.  Went to [facility] September 24, 2018 with panic.  Workup was normal.  Seen September 25, 2018.  She had been having some panic and insomnia and alprazolam was adjusted slightly.  It was suggested she take vitamin D through the winter for her history of seasonal depression.  No other med changes were made.  Been consistent with the meds.  Last seen March 10, 2019.  No meds were changed.  Doing good.  No further panic.  Anxiety a bit around Xmas but managed.  Not much seasonal depression.  Xanax 0.25 at sleep. Still taking propranolol BID.  A lot better.  Propranolol helps the palpitations and split the dosage am and pm with good results.  No panic now.  Patient reports stable mood and denies depressed or irritable moods.  Patient denies any recent difficulty with anxiety.  Patient denies difficulty with sleep initiation or maintenance. Sleep 8-9 hours straight through the night.  Denies appetite disturbance.  Patient reports that energy and motivation have been good.  Patient denies any difficulty with concentration.  Patient denies any suicidal ideation.  No sig seasonal depression.  Not crazy about job but function is good.  Past Psychiatric Medication Trials: Propranolol 20 mg twice daily, Wellbutrin XL, lamotrigine 200, sertraline, duloxetine, paroxetine, venlafaxine worsened anxiety, Abilify, lithium weight gain, lamotrigine   Review of Systems:  Review of Systems  Neurological: Negative for dizziness, tremors and weakness.  Psychiatric/Behavioral: Negative for agitation, behavioral problems,  confusion, decreased concentration, dysphoric mood, hallucinations, self-injury, sleep disturbance and suicidal ideas. The patient is not hyperactive.     Medications: I have reviewed the patient's current medications.  Current Outpatient Medications  Medication Sig Dispense Refill  . ALPRAZolam (XANAX) 0.25 MG tablet TAKE 1/2 TABLET BY MOUTH EVERY NIGHT AT BEDTIME AND 1 TABLET BY MOUTH EVERY DAY AS NEEDED FOR ANXIETY 45 tablet 5  . B Complex-Folic Acid (B COMPLEX-VITAMIN B12 PO) Take 1 tablet by mouth daily.      Marland Kitchen buPROPion (WELLBUTRIN XL) 150 MG 24 hr tablet Take 1 tablet (150 mg total) by mouth daily. 90 tablet 1  . Cholecalciferol (VITAMIN D-3 PO) Take 2 tablets by mouth daily.     Marland Kitchen lamoTRIgine (LAMICTAL) 200 MG tablet Take 1 tablet (200 mg total) by mouth daily. 90 tablet 1  . propranolol (INDERAL) 20 MG tablet Take 2 tablets (40 mg total) by mouth daily. 180 tablet 0  . valACYclovir (VALTREX) 500 MG tablet Take 500 mg by mouth daily.      No current facility-administered medications for this visit.    Medication Side Effects: None  Allergies: No Known Allergies  Past Medical History:  Diagnosis Date  . Anxiety   . Depression    takes wellbutrin, lamictal  . GERD (gastroesophageal reflux disease)    no meds    Family History  Adopted: Yes    Social History   Socioeconomic History  . Marital status: Married    Spouse name: Not on file  . Number of children: Not on file  . Years of  education: Not on file  . Highest education level: Not on file  Occupational History  . Not on file  Tobacco Use  . Smoking status: Never Smoker  . Smokeless tobacco: Never Used  Substance and Sexual Activity  . Alcohol use: Yes  . Drug use: No  . Sexual activity: Yes  Other Topics Concern  . Not on file  Social History Narrative  . Not on file   Social Determinants of Health   Financial Resource Strain:   . Difficulty of Paying Living Expenses: Not on file  Food  Insecurity:   . Worried About Programme researcher, broadcasting/film/video in the Last Year: Not on file  . Ran Out of Food in the Last Year: Not on file  Transportation Needs:   . Lack of Transportation (Medical): Not on file  . Lack of Transportation (Non-Medical): Not on file  Physical Activity:   . Days of Exercise per Week: Not on file  . Minutes of Exercise per Session: Not on file  Stress:   . Feeling of Stress : Not on file  Social Connections:   . Frequency of Communication with Friends and Family: Not on file  . Frequency of Social Gatherings with Friends and Family: Not on file  . Attends Religious Services: Not on file  . Active Member of Clubs or Organizations: Not on file  . Attends Banker Meetings: Not on file  . Marital Status: Not on file  Intimate Partner Violence:   . Fear of Current or Ex-Partner: Not on file  . Emotionally Abused: Not on file  . Physically Abused: Not on file  . Sexually Abused: Not on file    Past Medical History, Surgical history, Social history, and Family history were reviewed and updated as appropriate.   Please see review of systems for further details on the patient's review from today.   Objective:   Physical Exam:  LMP 02/26/2011   Physical Exam Constitutional:      General: She is not in acute distress.    Appearance: Normal appearance. She is well-developed.  Musculoskeletal:        General: No deformity.  Neurological:     Mental Status: She is alert and oriented to person, place, and time.     Motor: No tremor.     Coordination: Coordination normal.     Gait: Gait normal.  Psychiatric:        Attention and Perception: Attention normal. She does not perceive auditory hallucinations.        Mood and Affect: Mood is not anxious or depressed. Affect is not labile, blunt, angry or inappropriate.        Speech: Speech normal. Speech is not slurred.        Behavior: Behavior normal.        Thought Content: Thought content normal.  Thought content does not include homicidal or suicidal ideation. Thought content does not include homicidal or suicidal plan.        Cognition and Memory: Cognition normal.        Judgment: Judgment normal.     Comments: Insight intact. No auditory or visual hallucinations. No delusions.      Lab Review:  No results found for: NA, K, CL, CO2, GLUCOSE, BUN, CREATININE, CALCIUM, PROT, ALBUMIN, AST, ALT, ALKPHOS, BILITOT, GFRNONAA, GFRAA     Component Value Date/Time   WBC 5.1 06/07/2011 0918   RBC 4.22 06/07/2011 0918   HGB 13.7 06/07/2011 0918   HCT  40.2 06/07/2011 0918   PLT 173 06/07/2011 0918   MCV 95.3 06/07/2011 0918   MCH 32.5 06/07/2011 0918   MCHC 34.1 06/07/2011 0918   RDW 12.8 06/07/2011 0918    No results found for: POCLITH, LITHIUM   No results found for: PHENYTOIN, PHENOBARB, VALPROATE, CBMZ   Normal recent vitamin D levels  .res Assessment: Plan:    Debra Coleman was seen today for follow-up and other.  Diagnoses and all orders for this visit:  Panic disorder with agoraphobia -     propranolol (INDERAL) 20 MG tablet; Take 2 tablets (40 mg total) by mouth daily.  Bipolar 2 disorder (HCC) -     lamoTRIgine (LAMICTAL) 200 MG tablet; Take 1 tablet (200 mg total) by mouth daily. -     buPROPion (WELLBUTRIN XL) 150 MG 24 hr tablet; Take 1 tablet (150 mg total) by mouth daily.  Insomnia due to mental condition  Generalized anxiety disorder -     propranolol (INDERAL) 20 MG tablet; Take 2 tablets (40 mg total) by mouth daily.  Low vitamin D level  Other orders -     ALPRAZolam (XANAX) 0.25 MG tablet; TAKE 1/2 TABLET BY MOUTH EVERY NIGHT AT BEDTIME AND 1 TABLET BY MOUTH EVERY DAY AS NEEDED FOR ANXIETY    She had a relapse of panic disorder back in the winter 2020.  Minor adjustments got her through that crisis and she has been fine since then.  She is not having any significant depression either.  Her insomnia is managed.  The propranolol is helpful for the anxiety  and palpitations.  Overall she is done reasonably well on this medication combination since 2010.  She has a long psychiatric history dating back to 31.  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   We discussed the short-term risks associated with benzodiazepines including sedation and increased fall risk among others.  Discussed long-term side effect risk including dependence, potential withdrawal symptoms, and the potential eventual dose-related risk of dementia. Use the lowest effective dose necessary to maintain sleep and guard against panic.  She is managing on a very low dosage of Xanax.  Take vitamin D increase to 5000 U daily through the winter off label for seasonal depression  Could probably drop HS propranolol.  No med changes.   Educated re Panic disorder.  Vitamin D levels now normal.  15 minutes today  FU 6 mos  Lynder Parents, MD, DFAPA   Please see After Visit Summary for patient specific instructions.  No future appointments.  No orders of the defined types were placed in this encounter.     -------------------------------

## 2019-12-29 ENCOUNTER — Other Ambulatory Visit: Payer: Self-pay

## 2019-12-29 MED ORDER — ALPRAZOLAM 0.25 MG PO TABS: ORAL_TABLET | ORAL | 5 refills | Status: DC

## 2020-01-15 ENCOUNTER — Other Ambulatory Visit: Payer: Self-pay | Admitting: Psychiatry

## 2020-01-15 DIAGNOSIS — F411 Generalized anxiety disorder: Secondary | ICD-10-CM

## 2020-01-15 DIAGNOSIS — F4001 Agoraphobia with panic disorder: Secondary | ICD-10-CM

## 2020-04-21 ENCOUNTER — Ambulatory Visit
Admission: RE | Admit: 2020-04-21 | Discharge: 2020-04-21 | Disposition: A | Discharge status: Home / Self Care | Payer: 59 | Source: Ambulatory Visit | Attending: Emergency Medicine | Admitting: Emergency Medicine

## 2020-04-21 ENCOUNTER — Other Ambulatory Visit: Payer: Self-pay

## 2020-04-21 VITALS — BP 121/82 | HR 85 | Temp 98.4°F | Resp 18

## 2020-04-21 DIAGNOSIS — Z1152 Encounter for screening for COVID-19: Secondary | ICD-10-CM

## 2020-04-21 DIAGNOSIS — R5381 Other malaise: Secondary | ICD-10-CM

## 2020-04-21 DIAGNOSIS — R5383 Other fatigue: Secondary | ICD-10-CM | POA: Diagnosis not present

## 2020-04-21 DIAGNOSIS — K219 Gastro-esophageal reflux disease without esophagitis: Secondary | ICD-10-CM

## 2020-04-21 LAB — POCT URINALYSIS DIP (MANUAL ENTRY)
Bilirubin, UA: NEGATIVE
Glucose, UA: NEGATIVE mg/dL
Ketones, POC UA: NEGATIVE mg/dL
Leukocytes, UA: NEGATIVE
Nitrite, UA: NEGATIVE
Protein Ur, POC: NEGATIVE mg/dL
Spec Grav, UA: >=1.03 — AB (ref 1.010–1.025)
Urobilinogen, UA: 0.2 E.U./dL
pH, UA: 6.5 (ref 5.0–8.0)

## 2020-04-21 MED ORDER — OMEPRAZOLE 40 MG PO CPDR: 40.0000 mg | DELAYED_RELEASE_CAPSULE | Freq: Every day | ORAL | 0 refills | Status: AC

## 2020-04-21 NOTE — Discharge Instructions (Addendum)
Your COVID test is pending - it is important to quarantine / isolate at home until your results are back. °If you test positive and would like further evaluation for persistent or worsening symptoms, you may schedule an E-visit or virtual (video) visit throughout the [facility] MyChart app or website. ° °PLEASE NOTE: If you develop severe chest pain or shortness of breath please go to the ER or call 9-1-1 for further evaluation --> DO NOT schedule electronic or virtual visits for this. °Please call our office for further guidance / recommendations as needed. ° °For information about the Covid vaccine, please visit [email or web address] °

## 2020-04-21 NOTE — ED Provider Notes (Signed)
[name]    CSN: [id number] Arrival date & time: 04/21/20  1447      History   Chief Complaint Chief Complaint  Patient presents with  . Fatigue    HPI Debra Coleman is a 54 y.o. female with history of GERD, Debra Coleman, depression presenting for 4-day course of fatigue, generalized/low-grade headache, lower abdominal cramping, nausea.  Has noticed increase reflux, burping.  Has not taken omeprazole "in a while ".  Has taken Pepto with some relief.  Denies chest pain, cough, shortness of breath.  No known sick contacts.  Patient is fully Covid vaccinated.  Denies dizziness, numbness, lightheadedness, syncopal event.    Past Medical History:  Diagnosis Date  . Anxiety   . Depression    takes wellbutrin, lamictal  . GERD (gastroesophageal reflux disease)    no meds    There are no problems to display for this patient.   Past Surgical History:  Procedure Laterality Date  . BLADDER SUSPENSION  06/07/2011   Procedure: TRANSVAGINAL TAPE (TVT) PROCEDURE;  Surgeon: Juluis Mire;  Location: [facility];  Service: Gynecology;  Laterality: N/A;  transobturator sling  . BREAST BIOPSY Left 07/20/2015  . COLONOSCOPY  2011  . TUBAL LIGATION     2002    OB History   No obstetric history on file.      Home Medications    Prior to Admission medications   Medication Sig Start Date End Date Taking? Authorizing Provider  ALPRAZolam (XANAX) 0.25 MG tablet TAKE 1/2 TABLET BY MOUTH EVERY NIGHT AT BEDTIME AND 1 TABLET BY MOUTH EVERY DAY AS NEEDED FOR ANXIETY 12/29/19   Cottle, Steva Ready., MD  B Complex-Folic Acid (B COMPLEX-VITAMIN B12 PO) Take 1 tablet by mouth daily.      [provider]  buPROPion (WELLBUTRIN XL) 150 MG 24 hr tablet Take 1 tablet (150 mg total) by mouth daily. 10/14/19   Cottle, Steva Ready., MD  Cholecalciferol (VITAMIN D-3 PO) Take 2 tablets by mouth daily.     [provider]  lamoTRIgine (LAMICTAL) 200 MG tablet Take 1 tablet (200 mg  total) by mouth daily. 10/14/19   Cottle, Steva Ready., MD  omeprazole (PRILOSEC) 40 MG capsule Take 1 capsule (40 mg total) by mouth daily. 04/21/20   Hall-Potvin, [place], PA-C  propranolol (INDERAL) 20 MG tablet TAKE 2 TABLETS BY MOUTH DAILY. 01/15/20   Cottle, Steva Ready., MD  valACYclovir (VALTREX) 500 MG tablet Take 500 mg by mouth daily.     [provider]    Family History Family History  Adopted: Yes    Social History Social History   Tobacco Use  . Smoking status: Never Smoker  . Smokeless tobacco: Never Used  Substance Use Topics  . Alcohol use: Not Currently  . Drug use: No     Allergies   Patient has no known allergies.   Review of Systems As per HPI   Physical Exam Triage Vital Signs ED Triage Vitals  Enc Vitals Group     BP      Pulse      Resp      Temp      Temp src      SpO2      Weight      Height      Head Circumference      Peak Flow      Pain Score      Pain Loc  Pain Edu?      Excl. in GC?    No data found.  Updated Vital Signs BP 121/82 (BP Location: Left Arm)   Pulse 85   Temp 98.4 F (36.9 C) (Oral)   Resp 18   LMP 02/26/2011   SpO2 96%   Visual Acuity Right Eye Distance:   Left Eye Distance:   Bilateral Distance:    Right Eye Near:   Left Eye Near:    Bilateral Near:     Physical Exam Constitutional:      General: She is not in acute distress.    Appearance: She is not ill-appearing.  HENT:     Head: Normocephalic and atraumatic.     Right Ear: Tympanic membrane and ear canal normal.     Left Ear: Tympanic membrane and ear canal normal.     Nose: Nose normal.     Mouth/Throat:     Mouth: Mucous membranes are moist.     Pharynx: Oropharynx is clear.  Eyes:     General: No scleral icterus.    Pupils: Pupils are equal, round, and reactive to light.  Cardiovascular:     Rate and Rhythm: Normal rate and regular rhythm.  Pulmonary:     Effort: Pulmonary effort is normal. No respiratory distress.      Breath sounds: No wheezing or rales.  Abdominal:     Palpations: Abdomen is soft.     Tenderness: There is no abdominal tenderness.  Musculoskeletal:     Cervical back: No tenderness.  Lymphadenopathy:     Cervical: No cervical adenopathy.  Skin:    Coloration: Skin is not jaundiced or pale.  Neurological:     General: No focal deficit present.     Mental Status: She is alert and oriented to person, place, and time.      UC Treatments / Results  Labs (all labs ordered are listed, but only abnormal results are displayed) Labs Reviewed  POCT URINALYSIS DIP (MANUAL ENTRY) - Abnormal; Notable for the following components:      Result Value   Spec Grav, UA >=1.030 (*)    Blood, UA trace-intact (*)    All other components within normal limits  NOVEL CORONAVIRUS, NAA    EKG   Radiology No results found.  Procedures Procedures (including critical care time)  Medications Ordered in UC Medications - No data to display  Initial Impression / Assessment and Plan / UC Course  I have reviewed the triage vital signs and the nursing notes.  Pertinent labs & imaging results that were available during my care of the patient were reviewed by me and considered in my medical decision making (see chart for details).     Patient afebrile, nontoxic, with SpO2 96%.  Urine inconsistent with infection.  Covid PCR pending.  Patient to quarantine until results are back.  We will treat supportively as outlined below.  Return precautions discussed, patient verbalized understanding and is agreeable to plan. Final Clinical Impressions(s) / UC Diagnoses   Final diagnoses:  Encounter for screening for COVID-19  Malaise and fatigue     Discharge Instructions     Your COVID test is pending - it is important to quarantine / isolate at home until your results are back. If you test positive and would like further evaluation for persistent or worsening symptoms, you may schedule an E-visit or  virtual (video) visit throughout the [facility] app or website.  PLEASE NOTE: If you develop severe chest  pain or shortness of breath please go to the ER or call 9-1-1 for further evaluation --> DO NOT schedule electronic or virtual visits for this. Please call our office for further guidance / recommendations as needed.  For information about the Covid vaccine, please visit [email or web address]    ED Prescriptions    Medication Sig Dispense Auth. Provider   omeprazole (PRILOSEC) 40 MG capsule Take 1 capsule (40 mg total) by mouth daily. 30 capsule Hall-Potvin, [place], PA-C     PDMP not reviewed this encounter.   Hall-Potvin, [place], [place] 04/21/20 1751

## 2020-04-21 NOTE — ED Triage Notes (Signed)
Pt c/o fatigue, headache, lower abdominal cramping, and nausea since Saturday. States had a slight cough every now and then. States this morning felt like she was going to vomit or have diarrhea, took pepto with relief.

## 2020-04-22 LAB — SARS-COV-2, NAA 2 DAY TAT

## 2020-04-22 LAB — NOVEL CORONAVIRUS, NAA: SARS-CoV-2, NAA: NOT DETECTED

## 2020-05-18 ENCOUNTER — Other Ambulatory Visit: Payer: Self-pay

## 2020-05-18 ENCOUNTER — Ambulatory Visit (INDEPENDENT_AMBULATORY_CARE_PROVIDER_SITE_OTHER): Payer: No Typology Code available for payment source | Admitting: Psychiatry

## 2020-05-18 ENCOUNTER — Encounter: Payer: Self-pay | Admitting: Psychiatry

## 2020-05-18 DIAGNOSIS — F4001 Agoraphobia with panic disorder: Secondary | ICD-10-CM

## 2020-05-18 DIAGNOSIS — F411 Generalized anxiety disorder: Secondary | ICD-10-CM | POA: Diagnosis not present

## 2020-05-18 DIAGNOSIS — R7989 Other specified abnormal findings of blood chemistry: Secondary | ICD-10-CM

## 2020-05-18 DIAGNOSIS — F5105 Insomnia due to other mental disorder: Secondary | ICD-10-CM | POA: Diagnosis not present

## 2020-05-18 DIAGNOSIS — F3181 Bipolar II disorder: Secondary | ICD-10-CM

## 2020-05-18 MED ORDER — LORAZEPAM 0.5 MG PO TABS: 0.5000 mg | ORAL_TABLET | Freq: Three times a day (TID) | ORAL | 2 refills | Status: DC

## 2020-05-18 NOTE — Patient Instructions (Signed)
Bambi Cottle [employer].  Zoila Shutter at [facility]

## 2020-05-18 NOTE — Progress Notes (Signed)
Debra Coleman [id number] 19-Jun-1966 54 y.o.  Subjective:   Patient ID:  Debra Coleman is a 54 y.o. (DOB 1966-06-11) female.  Chief Complaint:  Chief Complaint  Patient presents with  . Follow-up  . grief  . Stress    caretaking F with dementia.    HPI   Debra Coleman presents to the office today for follow-up of mood disorder and worsening anxiety.  Went to ER [facility] September 24, 2018 with panic.  Workup was normal.  Seen September 25, 2018.  She had been having some panic and insomnia and alprazolam was adjusted slightly.  It was suggested she take vitamin D through the winter for her history of seasonal depression.  No other med changes were made.  Been consistent with the meds.  seen March 10, 2019 & Jan 2021.  No meds were changed. Stayed on Welllbutrin 150, lamotrigine 200 mg daily.  05/18/20 appt with following noted: M died suddenly MI FEB and she was caretaker of F with dementia.  She had to move in to care for him and take leave from work. Then got a work from home job.  Increasing work demands and stress. Kids stayiing in their house. Some depression and anxiety is worse. Taking Xanax 0.25 mg at night again.  Can't take in day DT grogginess.  Xanax 0.25 at sleep. Still taking propranolol BID.  A lot better.  Propranolol helps the palpitations and split the dosage am and pm with good results.  No panic now.  Patient reports stable mood and denies depressed or irritable moods.  Patient denies any recent difficulty with anxiety.  Patient denies difficulty with sleep initiation or maintenance. Sleep 8-9 hours straight through the night.  Denies appetite disturbance.  Patient reports that energy and motivation have been good.  Patient denies any difficulty with concentration.  Patient denies any suicidal ideation.  No sig seasonal depression.  Not crazy about job but function is good.  Past Psychiatric Medication Trials: Propranolol 20 mg twice daily, Wellbutrin XL,  lamotrigine 200,  sertraline, duloxetine, paroxetine, venlafaxine worsened anxiety,  Abilify, lithium weight gain,  Xanax  Review of Systems:  Review of Systems  Cardiovascular: Negative for chest pain.  Neurological: Negative for dizziness, tremors and weakness.  Psychiatric/Behavioral: Negative for agitation, behavioral problems, confusion, decreased concentration, dysphoric mood, hallucinations, self-injury, sleep disturbance and suicidal ideas. The patient is not hyperactive.     Medications: I have reviewed the patient's current medications.  Current Outpatient Medications  Medication Sig Dispense Refill  . B Complex-Folic Acid (B COMPLEX-VITAMIN B12 PO) Take 1 tablet by mouth daily.      Marland Kitchen buPROPion (WELLBUTRIN XL) 150 MG 24 hr tablet Take 1 tablet (150 mg total) by mouth daily. 90 tablet 1  . Cholecalciferol (VITAMIN D-3 PO) Take 2 tablets by mouth daily.     Marland Kitchen lamoTRIgine (LAMICTAL) 200 MG tablet Take 1 tablet (200 mg total) by mouth daily. 90 tablet 1  . omeprazole (PRILOSEC) 40 MG capsule Take 1 capsule (40 mg total) by mouth daily. 30 capsule 0  . propranolol (INDERAL) 20 MG tablet TAKE 2 TABLETS BY MOUTH DAILY. 180 tablet 1  . valACYclovir (VALTREX) 500 MG tablet Take 500 mg by mouth daily.     Marland Kitchen LORazepam (ATIVAN) 0.5 MG tablet Take 1 tablet (0.5 mg total) by mouth every 8 (eight) hours. 0.25-0.5 mg BID prn anxiety and 0.5-1.0 mg HS for sleep 90 tablet 2   No current facility-administered medications for this visit.  Medication Side Effects: None  Allergies: No Known Allergies  Past Medical History:  Diagnosis Date  . Anxiety   . Depression    takes wellbutrin, lamictal  . GERD (gastroesophageal reflux disease)    no meds    Family History  Adopted: Yes    Social History   Socioeconomic History  . Marital status: Married    Spouse name: Not on file  . Number of children: Not on file  . Years of education: Not on file  . Highest education level: Not  on file  Occupational History  . Not on file  Tobacco Use  . Smoking status: Never Smoker  . Smokeless tobacco: Never Used  Substance and Sexual Activity  . Alcohol use: Not Currently  . Drug use: No  . Sexual activity: Yes  Other Topics Concern  . Not on file  Social History Narrative  . Not on file   Social Determinants of Health   Financial Resource Strain:   . Difficulty of Paying Living Expenses: Not on file  Food Insecurity:   . Worried About Programme researcher, broadcasting/film/video in the Last Year: Not on file  . Ran Out of Food in the Last Year: Not on file  Transportation Needs:   . Lack of Transportation (Medical): Not on file  . Lack of Transportation (Non-Medical): Not on file  Physical Activity:   . Days of Exercise per Week: Not on file  . Minutes of Exercise per Session: Not on file  Stress:   . Feeling of Stress : Not on file  Social Connections:   . Frequency of Communication with Friends and Family: Not on file  . Frequency of Social Gatherings with Friends and Family: Not on file  . Attends Religious Services: Not on file  . Active Member of Clubs or Organizations: Not on file  . Attends Banker Meetings: Not on file  . Marital Status: Not on file  Intimate Partner Violence:   . Fear of Current or Ex-Partner: Not on file  . Emotionally Abused: Not on file  . Physically Abused: Not on file  . Sexually Abused: Not on file    Past Medical History, Surgical history, Social history, and Family history were reviewed and updated as appropriate.   Please see review of systems for further details on the patient's review from today.   Objective:   Physical Exam:  LMP 02/26/2011   Physical Exam Constitutional:      General: She is not in acute distress.    Appearance: Normal appearance. She is well-developed.  Musculoskeletal:        General: No deformity.  Neurological:     Mental Status: She is alert and oriented to person, place, and time.     Motor:  No tremor.     Coordination: Coordination normal.     Gait: Gait normal.  Psychiatric:        Attention and Perception: Attention normal. She does not perceive auditory hallucinations.        Mood and Affect: Mood is anxious and depressed. Affect is not labile, blunt, angry or inappropriate.        Speech: Speech normal. Speech is not slurred.        Behavior: Behavior normal.        Thought Content: Thought content normal. Thought content does not include homicidal or suicidal ideation. Thought content does not include homicidal or suicidal plan.        Cognition and Memory:  Cognition normal.        Judgment: Judgment normal.     Comments: Insight intact. No auditory or visual hallucinations. No delusions.      Lab Review:  No results found for: NA, K, CL, CO2, GLUCOSE, BUN, CREATININE, CALCIUM, PROT, ALBUMIN, AST, ALT, ALKPHOS, BILITOT, GFRNONAA, GFRAA     Component Value Date/Time   WBC 5.1 06/07/2011 0918   RBC 4.22 06/07/2011 0918   HGB 13.7 06/07/2011 0918   HCT 40.2 06/07/2011 0918   PLT 173 06/07/2011 0918   MCV 95.3 06/07/2011 0918   MCH 32.5 06/07/2011 0918   MCHC 34.1 06/07/2011 0918   RDW 12.8 06/07/2011 0918    No results found for: POCLITH, LITHIUM   No results found for: PHENYTOIN, PHENOBARB, VALPROATE, CBMZ   Normal recent vitamin D levels  .res Assessment: Plan:    Debra Coleman was seen today for follow-up, grief and stress.  Diagnoses and all orders for this visit:  Bipolar 2 disorder (HCC)  Panic disorder with agoraphobia -     LORazepam (ATIVAN) 0.5 MG tablet; Take 1 tablet (0.5 mg total) by mouth every 8 (eight) hours. 0.25-0.5 mg BID prn anxiety and 0.5-1.0 mg HS for sleep  Generalized anxiety disorder -     LORazepam (ATIVAN) 0.5 MG tablet; Take 1 tablet (0.5 mg total) by mouth every 8 (eight) hours. 0.25-0.5 mg BID prn anxiety and 0.5-1.0 mg HS for sleep  Insomnia due to mental condition -     LORazepam (ATIVAN) 0.5 MG tablet; Take 1 tablet (0.5  mg total) by mouth every 8 (eight) hours. 0.25-0.5 mg BID prn anxiety and 0.5-1.0 mg HS for sleep  Low vitamin D level    She had a relapse of panic disorder back in the winter 2020.  Minor adjustments got her through that crisis and she has been fine since then.  She is not having any significant depression either.  Her insomnia is managed.  The propranolol is helpful for the anxiety and palpitations.  Overall she is done reasonably well on this medication combination since 2010.  She has a long psychiatric history dating back to 49.  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   We discussed the short-term risks associated with benzodiazepines including sedation and increased fall risk among others.  Discussed long-term side effect risk including dependence, potential withdrawal symptoms, and the potential eventual dose-related risk of dementia. Switch to lorazepam for less daytime sleepiness and use temporarily with worsening stress. Ativan 0.25-0.5 mg BID prn anxiety and 0.5-1.0 mg HS for sleep  Take vitamin D increase to 5000 U daily through the winter off label for seasonal depression  Could probably drop HS propranolol.  No med changes.   Educated re Panic disorder.  Vitamin D levels now normal.  Asks about counselor for dealing with stress and developing coping mechanisms.  Debra Coleman or Debra Coleman depending on availability.  FU 3-4 mos  Meredith Staggers, MD, DFAPA   Please see After Visit Summary for patient specific instructions.  No future appointments.  No orders of the defined types were placed in this encounter.     -------------------------------

## 2020-06-15 ENCOUNTER — Other Ambulatory Visit: Payer: PRIVATE HEALTH INSURANCE

## 2020-06-15 DIAGNOSIS — Z20822 Contact with and (suspected) exposure to covid-19: Secondary | ICD-10-CM

## 2020-06-16 LAB — SARS-COV-2, NAA 2 DAY TAT

## 2020-06-16 LAB — NOVEL CORONAVIRUS, NAA: SARS-CoV-2, NAA: NOT DETECTED

## 2020-06-24 ENCOUNTER — Other Ambulatory Visit: Payer: Self-pay | Admitting: Psychiatry

## 2020-06-24 DIAGNOSIS — F3181 Bipolar II disorder: Secondary | ICD-10-CM

## 2020-06-26 ENCOUNTER — Other Ambulatory Visit: Payer: Self-pay

## 2020-06-26 DIAGNOSIS — F3181 Bipolar II disorder: Secondary | ICD-10-CM

## 2020-06-26 MED ORDER — LAMOTRIGINE 200 MG PO TABS: 200.0000 mg | ORAL_TABLET | Freq: Every day | ORAL | 0 refills | Status: DC

## 2020-08-17 ENCOUNTER — Ambulatory Visit: Payer: 59 | Admitting: Psychiatry

## 2020-08-17 ENCOUNTER — Other Ambulatory Visit: Payer: Self-pay | Admitting: Psychiatry

## 2020-08-17 ENCOUNTER — Telehealth: Payer: Self-pay | Admitting: Psychiatry

## 2020-08-17 DIAGNOSIS — F4001 Agoraphobia with panic disorder: Secondary | ICD-10-CM

## 2020-08-17 DIAGNOSIS — F411 Generalized anxiety disorder: Secondary | ICD-10-CM

## 2020-08-17 MED ORDER — PROPRANOLOL HCL 20 MG PO TABS: 40.0000 mg | ORAL_TABLET | Freq: Every day | ORAL | 1 refills | Status: DC

## 2020-08-17 NOTE — Telephone Encounter (Signed)
Pt would like a refill for Propranolol to be sent in at [facility] on [address]

## 2020-09-14 ENCOUNTER — Other Ambulatory Visit: Payer: Self-pay | Admitting: Psychiatry

## 2020-09-14 DIAGNOSIS — F4001 Agoraphobia with panic disorder: Secondary | ICD-10-CM

## 2020-09-14 DIAGNOSIS — F411 Generalized anxiety disorder: Secondary | ICD-10-CM

## 2020-09-14 DIAGNOSIS — F5105 Insomnia due to other mental disorder: Secondary | ICD-10-CM

## 2020-09-14 MED ORDER — LORAZEPAM 0.5 MG PO TABS: 0.5000 mg | ORAL_TABLET | Freq: Three times a day (TID) | ORAL | 0 refills | Status: DC

## 2020-09-15 ENCOUNTER — Other Ambulatory Visit: Payer: Self-pay | Admitting: Psychiatry

## 2020-09-15 DIAGNOSIS — F4001 Agoraphobia with panic disorder: Secondary | ICD-10-CM

## 2020-09-15 DIAGNOSIS — F411 Generalized anxiety disorder: Secondary | ICD-10-CM

## 2020-09-15 DIAGNOSIS — F5105 Insomnia due to other mental disorder: Secondary | ICD-10-CM

## 2020-09-15 MED ORDER — LORAZEPAM 0.5 MG PO TABS: 0.5000 mg | ORAL_TABLET | Freq: Three times a day (TID) | ORAL | 0 refills | Status: DC

## 2020-09-22 ENCOUNTER — Other Ambulatory Visit: Payer: Self-pay | Admitting: Psychiatry

## 2020-09-22 DIAGNOSIS — F3181 Bipolar II disorder: Secondary | ICD-10-CM

## 2020-10-14 ENCOUNTER — Encounter: Payer: Self-pay | Admitting: Psychiatry

## 2020-10-14 ENCOUNTER — Ambulatory Visit (INDEPENDENT_AMBULATORY_CARE_PROVIDER_SITE_OTHER): Payer: 59 | Admitting: Psychiatry

## 2020-10-14 ENCOUNTER — Other Ambulatory Visit: Payer: Self-pay

## 2020-10-14 DIAGNOSIS — F3181 Bipolar II disorder: Secondary | ICD-10-CM | POA: Diagnosis not present

## 2020-10-14 DIAGNOSIS — F5105 Insomnia due to other mental disorder: Secondary | ICD-10-CM

## 2020-10-14 DIAGNOSIS — F4001 Agoraphobia with panic disorder: Secondary | ICD-10-CM | POA: Diagnosis not present

## 2020-10-14 DIAGNOSIS — F411 Generalized anxiety disorder: Secondary | ICD-10-CM | POA: Diagnosis not present

## 2020-10-14 DIAGNOSIS — R7989 Other specified abnormal findings of blood chemistry: Secondary | ICD-10-CM

## 2020-10-14 MED ORDER — LORAZEPAM 0.5 MG PO TABS: 0.5000 mg | ORAL_TABLET | Freq: Three times a day (TID) | ORAL | 0 refills | Status: DC

## 2020-10-14 MED ORDER — LAMOTRIGINE 200 MG PO TABS: ORAL_TABLET | ORAL | 1 refills | Status: DC

## 2020-10-14 MED ORDER — BUPROPION HCL ER (XL) 150 MG PO TB24: 150.0000 mg | ORAL_TABLET | Freq: Every day | ORAL | 1 refills | Status: DC

## 2020-10-14 NOTE — Progress Notes (Signed)
Debra Coleman [id number] 1966/07/12 55 y.o.  Subjective:   Patient ID:  Debra Coleman is a 55 y.o. (DOB Apr 28, 1966) female.  Chief Complaint:  Chief Complaint  Patient presents with  . Follow-up    depression and anxiety    . Sleeping Problem    HPI   Debra Coleman presents to the office today for follow-up of mood disorder and worsening anxiety.  Went to ER [facility] September 24, 2018 with panic.  Workup was normal.  Seen September 25, 2018.  She had been having some panic and insomnia and alprazolam was adjusted slightly.  It was suggested she take vitamin D through the winter for her history of seasonal depression.  No other med changes were made.  Been consistent with the meds.  seen March 10, 2019 & Jan 2021.  No meds were changed. Stayed on Welllbutrin 150, lamotrigine 200 mg daily.  05/18/20 appt with following noted: M died suddenly MI FEB and she was caretaker of F with dementia.  She had to move in to care for him and take leave from work. Then got a work from home job.  Increasing work demands and stress. Kids stayiing in their house. Some depression and anxiety is worse. Taking Xanax 0.25 mg at night again.  Can't take in day DT grogginess. Xanax 0.25 at sleep. Still taking propranolol BID. A lot better.  Propranolol helps the palpitations and split the dosage am and pm with good results.  No panic now. Plan no med changes except switched Xanax to Ativan  10/14/2020 appt noted: Better overall.  Not taking Bz daytime but does at night.  Lorazepam less effective for sleep than Xanax but it's OK.   Still dealing with F with dementia. No SE. Bouts of reflux with GI work up.  More bouts of palpitations and lower energy.    Patient reports stable mood and denies depressed or irritable moods.  Patient denies any recent difficulty with anxiety.  Patient denies difficulty with sleep initiation or maintenance. Sleep 8-9 hours straight through the night.  Denies appetite  disturbance.  Patient reports that motivation have been good.  Patient denies any difficulty with concentration.  Patient denies any suicidal ideation.  No sig seasonal depression.  Not crazy about job but function is good.  Past Psychiatric Medication Trials: Propranolol 20 mg twice daily, Wellbutrin XL, lamotrigine 200,  sertraline, duloxetine, paroxetine, venlafaxine worsened anxiety,  Abilify, lithium weight gain,  Xanax  Review of Systems:  Review of Systems  Cardiovascular: Positive for palpitations. Negative for chest pain.  Gastrointestinal: Positive for abdominal pain.  Neurological: Negative for dizziness, tremors and weakness.  Psychiatric/Behavioral: Negative for agitation, behavioral problems, confusion, decreased concentration, dysphoric mood, hallucinations, self-injury, sleep disturbance and suicidal ideas. The patient is not hyperactive.     Medications: I have reviewed the patient's current medications.  Current Outpatient Medications  Medication Sig Dispense Refill  . B Complex-Folic Acid (B COMPLEX-VITAMIN B12 PO) Take 1 tablet by mouth daily.    . Cholecalciferol (VITAMIN D-3 PO) Take 2 tablets by mouth daily.    Marland Kitchen omeprazole (PRILOSEC) 40 MG capsule Take 1 capsule (40 mg total) by mouth daily. 30 capsule 0  . propranolol (INDERAL) 20 MG tablet Take 2 tablets (40 mg total) by mouth daily. 180 tablet 1  . valACYclovir (VALTREX) 500 MG tablet Take 500 mg by mouth daily.     Marland Kitchen buPROPion (WELLBUTRIN XL) 150 MG 24 hr tablet Take 1 tablet (150 mg total) by  mouth daily. 90 tablet 1  . lamoTRIgine (LAMICTAL) 200 MG tablet TAKE 1 TABLET(200 MG) BY MOUTH DAILY 90 tablet 1  . LORazepam (ATIVAN) 0.5 MG tablet Take 1 tablet (0.5 mg total) by mouth every 8 (eight) hours. 90 tablet 0   No current facility-administered medications for this visit.    Medication Side Effects: None  Allergies: No Known Allergies  Past Medical History:  Diagnosis Date  . Anxiety   .  Depression    takes wellbutrin, lamictal  . GERD (gastroesophageal reflux disease)    no meds    Family History  Adopted: Yes    Social History   Socioeconomic History  . Marital status: Married    Spouse name: Not on file  . Number of children: Not on file  . Years of education: Not on file  . Highest education level: Not on file  Occupational History  . Not on file  Tobacco Use  . Smoking status: Never Smoker  . Smokeless tobacco: Never Used  Substance and Sexual Activity  . Alcohol use: Not Currently  . Drug use: No  . Sexual activity: Yes  Other Topics Concern  . Not on file  Social History Narrative  . Not on file   Social Determinants of Health   Financial Resource Strain: Not on file  Food Insecurity: Not on file  Transportation Needs: Not on file  Physical Activity: Not on file  Stress: Not on file  Social Connections: Not on file  Intimate Partner Violence: Not on file    Past Medical History, Surgical history, Social history, and Family history were reviewed and updated as appropriate.   Please see review of systems for further details on the patient's review from today.   Objective:   Physical Exam:  LMP 02/26/2011   Physical Exam Constitutional:      General: She is not in acute distress.    Appearance: Normal appearance. She is well-developed.  Musculoskeletal:        General: No deformity.  Neurological:     Mental Status: She is alert and oriented to person, place, and time.     Motor: No tremor.     Coordination: Coordination normal.     Gait: Gait normal.  Psychiatric:        Attention and Perception: Attention normal. She does not perceive auditory hallucinations.        Mood and Affect: Mood is anxious. Mood is not depressed. Affect is not labile, blunt, angry or inappropriate.        Speech: Speech normal. Speech is not slurred.        Behavior: Behavior normal.        Thought Content: Thought content normal. Thought content does  not include homicidal or suicidal ideation. Thought content does not include homicidal or suicidal plan.        Cognition and Memory: Cognition normal.        Judgment: Judgment normal.     Comments: Insight intact. No auditory or visual hallucinations. No delusions.  Less anxious and depressed but not gone.     Lab Review:  No results found for: NA, K, CL, CO2, GLUCOSE, BUN, CREATININE, CALCIUM, PROT, ALBUMIN, AST, ALT, ALKPHOS, BILITOT, GFRNONAA, GFRAA     Component Value Date/Time   WBC 5.1 06/07/2011 0918   RBC 4.22 06/07/2011 0918   HGB 13.7 06/07/2011 0918   HCT 40.2 06/07/2011 0918   PLT 173 06/07/2011 0918   MCV 95.3 06/07/2011 0918  MCH 32.5 06/07/2011 0918   MCHC 34.1 06/07/2011 0918   RDW 12.8 06/07/2011 0918    No results found for: POCLITH, LITHIUM   No results found for: PHENYTOIN, PHENOBARB, VALPROATE, CBMZ   Normal recent vitamin D levels  .res Assessment: Plan:    Debra Coleman was seen today for follow-up and sleeping problem.  Diagnoses and all orders for this visit:  Panic disorder with agoraphobia -     LORazepam (ATIVAN) 0.5 MG tablet; Take 1 tablet (0.5 mg total) by mouth every 8 (eight) hours.  Generalized anxiety disorder -     LORazepam (ATIVAN) 0.5 MG tablet; Take 1 tablet (0.5 mg total) by mouth every 8 (eight) hours.  Bipolar 2 disorder (HCC) -     lamoTRIgine (LAMICTAL) 200 MG tablet; TAKE 1 TABLET(200 MG) BY MOUTH DAILY -     buPROPion (WELLBUTRIN XL) 150 MG 24 hr tablet; Take 1 tablet (150 mg total) by mouth daily.  Insomnia due to mental condition -     LORazepam (ATIVAN) 0.5 MG tablet; Take 1 tablet (0.5 mg total) by mouth every 8 (eight) hours.  Low vitamin D level    She had a relapse of panic disorder back in the winter 2020.  Minor adjustments got her through that crisis and she has been fine since then.  She is not having any significant depression either.  Her insomnia is managed.  The propranolol is helpful for the anxiety and  palpitations.  Overall she is done reasonably well on this medication combination since 2010.  She has a long psychiatric history dating back to 64.  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   We discussed the short-term risks associated with benzodiazepines including sedation and increased fall risk among others.  Discussed long-term side effect risk including dependence, potential withdrawal symptoms, and the potential eventual dose-related risk of dementia. Switch to lorazepam for less daytime sleepiness and use temporarily with worsening stress was successful but she's not using it daytime now. Ativan 0.25-0.5 mg BID prn anxiety and 0.5-1.0 mg HS for sleep  Take vitamin D increase to 5000 U daily through the winter off label for seasonal depression  Could probably drop HS propranolol.  No med changes.   Educated re Panic disorder.  Vitamin D levels now normal.  Asks about counselor for dealing with stress and developing coping mechanisms.  Debra Coleman or Debra Coleman depending on availability.  FU 3-4 mos  Meredith Staggers, MD, DFAPA   Please see After Visit Summary for patient specific instructions.  No future appointments.  No orders of the defined types were placed in this encounter.     -------------------------------

## 2020-11-24 ENCOUNTER — Other Ambulatory Visit: Payer: Self-pay | Admitting: Psychiatry

## 2020-11-24 DIAGNOSIS — F411 Generalized anxiety disorder: Secondary | ICD-10-CM

## 2020-11-24 DIAGNOSIS — F4001 Agoraphobia with panic disorder: Secondary | ICD-10-CM

## 2020-11-24 DIAGNOSIS — F5105 Insomnia due to other mental disorder: Secondary | ICD-10-CM

## 2020-12-28 ENCOUNTER — Other Ambulatory Visit: Payer: Self-pay | Admitting: Psychiatry

## 2020-12-28 DIAGNOSIS — F3181 Bipolar II disorder: Secondary | ICD-10-CM

## 2020-12-28 NOTE — Telephone Encounter (Signed)
Please review

## 2020-12-30 NOTE — Telephone Encounter (Signed)
This was routed to [facility]? Should it go to you?

## 2021-02-07 ENCOUNTER — Other Ambulatory Visit: Payer: Self-pay | Admitting: Psychiatry

## 2021-02-07 DIAGNOSIS — F4001 Agoraphobia with panic disorder: Secondary | ICD-10-CM

## 2021-02-07 DIAGNOSIS — F411 Generalized anxiety disorder: Secondary | ICD-10-CM

## 2021-02-28 ENCOUNTER — Other Ambulatory Visit: Payer: Self-pay | Admitting: Obstetrics and Gynecology

## 2021-02-28 DIAGNOSIS — R928 Other abnormal and inconclusive findings on diagnostic imaging of breast: Secondary | ICD-10-CM

## 2021-03-09 ENCOUNTER — Ambulatory Visit
Admission: RE | Admit: 2021-03-09 | Discharge: 2021-03-09 | Disposition: A | Discharge status: Home / Self Care | Payer: 59 | Source: Ambulatory Visit | Attending: Obstetrics and Gynecology | Admitting: Obstetrics and Gynecology

## 2021-03-09 ENCOUNTER — Other Ambulatory Visit: Payer: Self-pay

## 2021-03-09 ENCOUNTER — Other Ambulatory Visit: Payer: Self-pay | Admitting: Obstetrics and Gynecology

## 2021-03-09 ENCOUNTER — Ambulatory Visit
Admission: RE | Admit: 2021-03-09 | Discharge: 2021-03-09 | Disposition: A | Discharge status: Home / Self Care | Payer: No Typology Code available for payment source | Source: Ambulatory Visit | Attending: Obstetrics and Gynecology | Admitting: Obstetrics and Gynecology

## 2021-03-09 DIAGNOSIS — R928 Other abnormal and inconclusive findings on diagnostic imaging of breast: Secondary | ICD-10-CM

## 2021-03-21 ENCOUNTER — Other Ambulatory Visit: Payer: Self-pay | Admitting: Psychiatry

## 2021-03-21 DIAGNOSIS — F411 Generalized anxiety disorder: Secondary | ICD-10-CM

## 2021-03-21 DIAGNOSIS — F5105 Insomnia due to other mental disorder: Secondary | ICD-10-CM

## 2021-03-21 DIAGNOSIS — F4001 Agoraphobia with panic disorder: Secondary | ICD-10-CM

## 2021-03-22 ENCOUNTER — Other Ambulatory Visit: Payer: 59

## 2021-04-13 ENCOUNTER — Ambulatory Visit: Payer: 59 | Admitting: Psychiatry

## 2021-05-02 ENCOUNTER — Ambulatory Visit: Payer: 59 | Admitting: Psychiatry

## 2021-05-30 ENCOUNTER — Other Ambulatory Visit: Payer: Self-pay

## 2021-05-30 ENCOUNTER — Telehealth: Payer: Self-pay | Admitting: Psychiatry

## 2021-05-30 DIAGNOSIS — F411 Generalized anxiety disorder: Secondary | ICD-10-CM

## 2021-05-30 DIAGNOSIS — F4001 Agoraphobia with panic disorder: Secondary | ICD-10-CM

## 2021-05-30 DIAGNOSIS — F5105 Insomnia due to other mental disorder: Secondary | ICD-10-CM

## 2021-05-30 MED ORDER — LORAZEPAM 0.5 MG PO TABS: 0.5000 mg | ORAL_TABLET | Freq: Three times a day (TID) | ORAL | 0 refills | Status: DC | PRN

## 2021-05-30 NOTE — Telephone Encounter (Signed)
Pended.

## 2021-05-30 NOTE — Telephone Encounter (Signed)
Pt needs a refill on her lorazapam. She has changed pharmacies. She now uses [facility] on [address]. Please send script there and update pharmacy

## 2021-07-05 ENCOUNTER — Other Ambulatory Visit: Payer: Self-pay | Admitting: Psychiatry

## 2021-07-05 DIAGNOSIS — F5105 Insomnia due to other mental disorder: Secondary | ICD-10-CM

## 2021-07-05 DIAGNOSIS — F4001 Agoraphobia with panic disorder: Secondary | ICD-10-CM

## 2021-07-05 DIAGNOSIS — F411 Generalized anxiety disorder: Secondary | ICD-10-CM

## 2021-07-05 NOTE — Telephone Encounter (Signed)
Last filled 9/12 appt on 10/20

## 2021-07-07 ENCOUNTER — Ambulatory Visit (INDEPENDENT_AMBULATORY_CARE_PROVIDER_SITE_OTHER): Payer: No Typology Code available for payment source | Admitting: Psychiatry

## 2021-07-07 ENCOUNTER — Encounter: Payer: Self-pay | Admitting: Psychiatry

## 2021-07-07 ENCOUNTER — Other Ambulatory Visit: Payer: Self-pay

## 2021-07-07 DIAGNOSIS — F4001 Agoraphobia with panic disorder: Secondary | ICD-10-CM

## 2021-07-07 DIAGNOSIS — F411 Generalized anxiety disorder: Secondary | ICD-10-CM

## 2021-07-07 DIAGNOSIS — R7989 Other specified abnormal findings of blood chemistry: Secondary | ICD-10-CM

## 2021-07-07 DIAGNOSIS — F3181 Bipolar II disorder: Secondary | ICD-10-CM | POA: Diagnosis not present

## 2021-07-07 DIAGNOSIS — F5105 Insomnia due to other mental disorder: Secondary | ICD-10-CM

## 2021-07-07 MED ORDER — ALPRAZOLAM 0.25 MG PO TABS: 0.2500 mg | ORAL_TABLET | Freq: Every evening | ORAL | 5 refills | Status: DC | PRN

## 2021-07-07 MED ORDER — LAMOTRIGINE 200 MG PO TABS: ORAL_TABLET | ORAL | 1 refills | Status: DC

## 2021-07-07 MED ORDER — BUPROPION HCL ER (XL) 150 MG PO TB24: 150.0000 mg | ORAL_TABLET | Freq: Every day | ORAL | 1 refills | Status: DC

## 2021-07-07 NOTE — Progress Notes (Signed)
Debra Coleman [id number] 07/07/1966 55 y.o.  Subjective:   Patient ID:  Debra Coleman is a 55 y.o. (DOB Oct 17, 1965) female.  Chief Complaint:  Chief Complaint  Patient presents with   Follow-up   Panic disorder with agoraphobia   Bipolar 2 disorder     HPI   Debra Coleman presents to the office today for follow-up of mood disorder and worsening anxiety.  Went to ER [facility] September 24, 2018 with panic.  Workup was normal.  Seen September 25, 2018.  She had been having some panic and insomnia and alprazolam was adjusted slightly.  It was suggested she take vitamin D through the winter for her history of seasonal depression.  No other med changes were made.  Been consistent with the meds.  seen March 10, 2019 & Jan 2021.  No meds were changed. Stayed on Welllbutrin 150, lamotrigine 200 mg daily.  05/18/20 appt with following noted: M died suddenly MI FEB and she was caretaker of F with dementia.  She had to move in to care for him and take leave from work. Then got a work from home job.  Increasing work demands and stress. Kids stayiing in their house. Some depression and anxiety is worse. Taking Xanax 0.25 mg at night again.  Can't take in day DT grogginess. Xanax 0.25 at sleep. Still taking propranolol BID. A lot better.  Propranolol helps the palpitations and split the dosage am and pm with good results.  No panic now. Plan no med changes except switched Xanax to Ativan  10/14/2020 appt noted: Better overall.  Not taking Bz daytime but does at night.  Lorazepam less effective for sleep than Xanax but it's OK.   Still dealing with F with dementia. No SE. Bouts of reflux with GI work up.  More bouts of palpitations and lower energy. Plan no med changes  07/07/21 appt noted; Saw neuro for neuropathy and worsening tremors and increased to 40 mg daily. Taking Ativan 0.5 mg 2 tablets for sleep and doesn't work as good as Xanax for sleep. Not working at the moment and wants to  switch back to Xanax.  Not as anxious in the day. No mood swings since here.  D is depressed now.    Patient reports stable mood and denies depressed or irritable moods.  Patient denies any recent difficulty with anxiety.  Patient denies difficulty with sleep initiation or maintenance. Sleep 8-9 hours straight through the night.  Denies appetite disturbance.  Patient reports that motivation have been good.  Patient denies any difficulty with concentration.  Patient denies any suicidal ideation.  No sig seasonal depression.  Not crazy about job but function is good.  Past Psychiatric Medication Trials: Propranolol 20 mg twice daily, Wellbutrin XL, lamotrigine 200,  sertraline, duloxetine, paroxetine, venlafaxine worsened anxiety,  Abilify, lithium weight gain,  Xanax  Review of Systems:  Review of Systems  Cardiovascular:  Negative for chest pain and palpitations.  Gastrointestinal:  Negative for abdominal pain.  Neurological:  Negative for dizziness, tremors and weakness.  Psychiatric/Behavioral:  Negative for agitation, behavioral problems, confusion, decreased concentration, dysphoric mood, hallucinations, self-injury, sleep disturbance and suicidal ideas. The patient is not hyperactive.    Medications: I have reviewed the patient's current medications.  Current Outpatient Medications  Medication Sig Dispense Refill   ALPRAZolam (XANAX) 0.25 MG tablet Take 1 tablet (0.25 mg total) by mouth at bedtime as needed for anxiety. 30 tablet 5   B Complex-Folic Acid (B COMPLEX-VITAMIN B12 PO)  Take 1 tablet by mouth daily.     Cholecalciferol (VITAMIN D-3 PO) Take 2 tablets by mouth daily.     omeprazole (PRILOSEC) 40 MG capsule Take 1 capsule (40 mg total) by mouth daily. 30 capsule 0   propranolol (INDERAL) 20 MG tablet TAKE 2 TABLETS(40 MG) BY MOUTH DAILY 180 tablet 1   valACYclovir (VALTREX) 500 MG tablet Take 500 mg by mouth daily.      buPROPion (WELLBUTRIN XL) 150 MG 24 hr tablet Take 1  tablet (150 mg total) by mouth daily. 90 tablet 1   lamoTRIgine (LAMICTAL) 200 MG tablet TAKE 1 TABLET(200 MG) BY MOUTH DAILY 90 tablet 1   No current facility-administered medications for this visit.    Medication Side Effects: None  Allergies: No Known Allergies  Past Medical History:  Diagnosis Date   Anxiety    Depression    takes wellbutrin, lamictal   GERD (gastroesophageal reflux disease)    no meds    Family History  Adopted: Yes    Social History   Socioeconomic History   Marital status: Married    Spouse name: Not on file   Number of children: Not on file   Years of education: Not on file   Highest education level: Not on file  Occupational History   Not on file  Tobacco Use   Smoking status: Never   Smokeless tobacco: Never  Substance and Sexual Activity   Alcohol use: Not Currently   Drug use: No   Sexual activity: Yes  Other Topics Concern   Not on file  Social History Narrative   Not on file   Social Determinants of Health   Financial Resource Strain: Not on file  Food Insecurity: Not on file  Transportation Needs: Not on file  Physical Activity: Not on file  Stress: Not on file  Social Connections: Not on file  Intimate Partner Violence: Not on file    Past Medical History, Surgical history, Social history, and Family history were reviewed and updated as appropriate.   Please see review of systems for further details on the patient's review from today.   Objective:   Physical Exam:  LMP 02/26/2011   Physical Exam Constitutional:      General: She is not in acute distress.    Appearance: Normal appearance. She is well-developed.  Musculoskeletal:        General: No deformity.  Neurological:     Mental Status: She is alert and oriented to person, place, and time.     Motor: No tremor.     Coordination: Coordination normal.     Gait: Gait normal.  Psychiatric:        Attention and Perception: Attention normal. She does not  perceive auditory hallucinations.        Mood and Affect: Mood is not anxious or depressed. Affect is not labile, blunt, angry or inappropriate.        Speech: Speech normal. Speech is not slurred.        Behavior: Behavior normal.        Thought Content: Thought content normal. Thought content does not include homicidal or suicidal ideation. Thought content does not include homicidal or suicidal plan.        Cognition and Memory: Cognition normal.        Judgment: Judgment normal.     Comments: Insight intact. No auditory or visual hallucinations. No delusions.      Lab Review:  No results found for: NA,  K, CL, CO2, GLUCOSE, BUN, CREATININE, CALCIUM, PROT, ALBUMIN, AST, ALT, ALKPHOS, BILITOT, GFRNONAA, GFRAA     Component Value Date/Time   WBC 5.1 06/07/2011 0918   RBC 4.22 06/07/2011 0918   HGB 13.7 06/07/2011 0918   HCT 40.2 06/07/2011 0918   PLT 173 06/07/2011 0918   MCV 95.3 06/07/2011 0918   MCH 32.5 06/07/2011 0918   MCHC 34.1 06/07/2011 0918   RDW 12.8 06/07/2011 0918    No results found for: POCLITH, LITHIUM   No results found for: PHENYTOIN, PHENOBARB, VALPROATE, CBMZ   Normal recent vitamin D levels  .res Assessment: Plan:    Debra Coleman was seen today for follow-up, panic disorder with agoraphobia and bipolar 2 disorder .  Diagnoses and all orders for this visit:  Bipolar 2 disorder (HCC) -     buPROPion (WELLBUTRIN XL) 150 MG 24 hr tablet; Take 1 tablet (150 mg total) by mouth daily. -     lamoTRIgine (LAMICTAL) 200 MG tablet; TAKE 1 TABLET(200 MG) BY MOUTH DAILY  Panic disorder with agoraphobia  Generalized anxiety disorder  Insomnia due to mental condition -     ALPRAZolam (XANAX) 0.25 MG tablet; Take 1 tablet (0.25 mg total) by mouth at bedtime as needed for anxiety.  Low vitamin D level   She had a relapse of panic disorder back in the winter 2020.  Minor adjustments got her through that crisis and she has been fine since then.  She is not having any  significant depression either.  Her insomnia is managed.  The propranolol is helpful for the anxiety and palpitations.  Overall she is done reasonably well on this medication combination since 2010.  She has a long psychiatric history dating back to 39.  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   We discussed the short-term risks associated with benzodiazepines including sedation and increased fall risk among others.  Discussed long-term side effect risk including dependence, potential withdrawal symptoms, and the potential eventual dose-related risk of dementia. Switch to lorazepam for less daytime sleepiness and use temporarily with worsening stress was successful but she's not using it daytime now. Ativan 0.25-0.5 mg BID prn anxiety and 0.5-1.0 mg HS for sleep  Take vitamin D increase to 5000 U daily through the winter off label for seasonal depression  No med changes except switch BZ back to Xanax 0.25 mg bc better for sleep   Educated re Panic disorder.  Suppportive therapy and helping kids with psych problems.  Vitamin D levels now normal.  FU 6 mos  Meredith Staggers, MD, DFAPA   Please see After Visit Summary for patient specific instructions.  Future Appointments  Date Time Provider Department Center  09/08/2021  8:40 AM [facility] [place] [facility]  09/08/2021  8:50 AM [facility] [place] [facility]    No orders of the defined types were placed in this encounter.     -------------------------------

## 2021-08-05 ENCOUNTER — Other Ambulatory Visit: Payer: Self-pay | Admitting: Psychiatry

## 2021-08-05 ENCOUNTER — Telehealth: Payer: Self-pay | Admitting: Psychiatry

## 2021-08-05 DIAGNOSIS — F4001 Agoraphobia with panic disorder: Secondary | ICD-10-CM

## 2021-08-05 DIAGNOSIS — F5105 Insomnia due to other mental disorder: Secondary | ICD-10-CM

## 2021-08-05 DIAGNOSIS — F411 Generalized anxiety disorder: Secondary | ICD-10-CM

## 2021-08-05 MED ORDER — LORAZEPAM 0.5 MG PO TABS: 0.5000 mg | ORAL_TABLET | Freq: Three times a day (TID) | ORAL | 1 refills | Status: DC

## 2021-08-05 NOTE — Telephone Encounter (Signed)
OK I sent RX lorazepam.  She can take 1-2 tablets at night for sleep

## 2021-08-05 NOTE — Telephone Encounter (Signed)
Pt stated she is very groggy in the mornings and has a hard time getting her day started.She prefers ativan since she is clear headed on that med.

## 2021-08-05 NOTE — Telephone Encounter (Signed)
Pt informed

## 2021-08-05 NOTE — Telephone Encounter (Signed)
Patient lm stating Dr Jennelle Human changed her Xanax to be taken at night. She stated she is having increased brain fog. She is requesting to be switched back on the Lorazepram.

## 2021-09-08 ENCOUNTER — Ambulatory Visit
Admission: RE | Admit: 2021-09-08 | Discharge: 2021-09-08 | Disposition: A | Discharge status: Home / Self Care | Payer: No Typology Code available for payment source | Source: Ambulatory Visit | Attending: Obstetrics and Gynecology | Admitting: Obstetrics and Gynecology

## 2021-09-08 ENCOUNTER — Other Ambulatory Visit: Payer: Self-pay | Admitting: Obstetrics and Gynecology

## 2021-09-08 DIAGNOSIS — N6320 Unspecified lump in the left breast, unspecified quadrant: Secondary | ICD-10-CM

## 2021-09-08 DIAGNOSIS — R928 Other abnormal and inconclusive findings on diagnostic imaging of breast: Secondary | ICD-10-CM

## 2021-09-16 ENCOUNTER — Other Ambulatory Visit: Payer: Self-pay | Admitting: Psychiatry

## 2021-09-16 ENCOUNTER — Telehealth: Payer: Self-pay | Admitting: Psychiatry

## 2021-09-16 DIAGNOSIS — F3181 Bipolar II disorder: Secondary | ICD-10-CM

## 2021-09-16 MED ORDER — LAMOTRIGINE 100 MG PO TABS
200.0000 mg | ORAL_TABLET | Freq: Every day | ORAL | 0 refills | Status: DC
Start: 2021-09-16 — End: 2021-12-16

## 2021-09-16 NOTE — Telephone Encounter (Signed)
Since they are tablets should I suggest breaking them in half?Or do you have another option

## 2021-09-16 NOTE — Telephone Encounter (Signed)
Please let her know that I changed the prescription from one of the 200 mg tablets to 2 of the 100 mg tablets so they would be smaller and easier for her to swallow.  I sent them into the pharmacy and asked them to refill it early

## 2021-09-16 NOTE — Telephone Encounter (Signed)
Next visit is 01/05/22. Debra Coleman called and said that she recently has been having trouble swallowing the Lamotrigine 200 mg pills because they are larger pills. She said the manufacturer who makes these pills if [employer]. She is inquiring if there is a smaller pill she can take from a different manufacturer? If so please call it in at:  [name]   [address], [place], [place] [address]  [phone]  Her phone number is [phone].

## 2021-09-16 NOTE — Telephone Encounter (Signed)
Pt informed

## 2021-10-06 ENCOUNTER — Other Ambulatory Visit: Payer: Self-pay | Admitting: Psychiatry

## 2021-10-06 DIAGNOSIS — F411 Generalized anxiety disorder: Secondary | ICD-10-CM

## 2021-10-06 DIAGNOSIS — F5105 Insomnia due to other mental disorder: Secondary | ICD-10-CM

## 2021-10-06 DIAGNOSIS — F4001 Agoraphobia with panic disorder: Secondary | ICD-10-CM

## 2021-12-16 ENCOUNTER — Other Ambulatory Visit: Payer: Self-pay | Admitting: Psychiatry

## 2021-12-16 DIAGNOSIS — F411 Generalized anxiety disorder: Secondary | ICD-10-CM

## 2021-12-16 DIAGNOSIS — F4001 Agoraphobia with panic disorder: Secondary | ICD-10-CM

## 2021-12-16 DIAGNOSIS — F5105 Insomnia due to other mental disorder: Secondary | ICD-10-CM

## 2021-12-16 DIAGNOSIS — F3181 Bipolar II disorder: Secondary | ICD-10-CM

## 2022-01-05 ENCOUNTER — Ambulatory Visit (INDEPENDENT_AMBULATORY_CARE_PROVIDER_SITE_OTHER): Payer: PRIVATE HEALTH INSURANCE | Admitting: Psychiatry

## 2022-01-05 ENCOUNTER — Encounter: Payer: Self-pay | Admitting: Psychiatry

## 2022-01-05 DIAGNOSIS — F411 Generalized anxiety disorder: Secondary | ICD-10-CM | POA: Diagnosis not present

## 2022-01-05 DIAGNOSIS — F4001 Agoraphobia with panic disorder: Secondary | ICD-10-CM | POA: Diagnosis not present

## 2022-01-05 DIAGNOSIS — F5105 Insomnia due to other mental disorder: Secondary | ICD-10-CM

## 2022-01-05 DIAGNOSIS — F3181 Bipolar II disorder: Secondary | ICD-10-CM | POA: Diagnosis not present

## 2022-01-05 DIAGNOSIS — R7989 Other specified abnormal findings of blood chemistry: Secondary | ICD-10-CM

## 2022-01-05 MED ORDER — LORAZEPAM 0.5 MG PO TABS: 0.5000 mg | ORAL_TABLET | Freq: Three times a day (TID) | ORAL | 5 refills | Status: DC

## 2022-01-05 MED ORDER — LAMOTRIGINE 100 MG PO TABS: 100.0000 mg | ORAL_TABLET | Freq: Two times a day (BID) | ORAL | 1 refills | Status: DC

## 2022-01-05 MED ORDER — BUPROPION HCL ER (XL) 150 MG PO TB24: 150.0000 mg | ORAL_TABLET | Freq: Every day | ORAL | 1 refills | Status: DC

## 2022-01-05 NOTE — Progress Notes (Signed)
Debra PenmanDebra Leigh Coleman ?[id number] ?1966/04/01 ?56 y.o. ? ?Subjective:  ? ?Patient ID:  Debra PenmanDebra Leigh Pautsch is a 56 y.o. (DOB 1966/04/01) female. ? ?Chief Complaint:  ?Chief Complaint  ?Patient presents with  ? Follow-up  ?  Mood anxiety and sleep  ? ? ?HPI   ?Debra PenmanDebra Leigh Shane presents to the office today for follow-up of mood disorder and worsening anxiety. ? ?Went to ER [facility] September 24, 2018 with panic.  Workup was normal. ? ?Seen September 25, 2018.  She had been having some panic and insomnia and alprazolam was adjusted slightly.  It was suggested she take vitamin D through the winter for her history of seasonal depression.  No other med changes were made.  Been consistent with the meds. ? ?seen March 10, 2019 & Jan 2021.  No meds were changed. Stayed on Welllbutrin 150, lamotrigine 200 mg daily. ? ?05/18/20 appt with following noted: ?M died suddenly MI FEB and she was caretaker of F with dementia.  She had to move in to care for him and take leave from work. Then got a work from home job.  Increasing work demands and stress. ?Kids stayiing in their house. ?Some depression and anxiety is worse. ?Taking Xanax 0.25 mg at night again.  Can't take in day DT grogginess. ?Xanax 0.25 at sleep. Still taking propranolol BID. ?A lot better.  Propranolol helps the palpitations and split the dosage am and pm with good results.  No panic now. ?Plan no med changes except switched Xanax to Ativan ? ?10/14/2020 appt noted: ?Better overall.  Not taking Bz daytime but does at night.  Lorazepam less effective for sleep than Xanax but it's OK.   ?Still dealing with F with dementia. ?No SE. ?Bouts of reflux with GI work up.  More bouts of palpitations and lower energy. ?Plan no med changes ? ?07/07/21 appt noted; ?Saw neuro for neuropathy and worsening tremors and increased to 40 mg daily. ?Taking Ativan 0.5 mg 2 tablets for sleep and doesn't work as good as Xanax for sleep. ?Not working at the moment and wants to switch back to Xanax.  Not as  anxious in the day. ?No mood swings since here.  D is depressed now. ?  Patient reports stable mood and denies depressed or irritable moods.  Patient denies any recent difficulty with anxiety.  Patient denies difficulty with sleep initiation or maintenance. Sleep 8-9 hours straight through the night.  Denies appetite disturbance.  Patient reports that motivation have been good.  Patient denies any difficulty with concentration.  Patient denies any suicidal ideation. ?No sig seasonal depression.  Not crazy about job but function is good. ?Plan: No med changes except switch BZ back to Xanax 0.25 mg bc better for sleep ? ?01/05/2022 appointment with the following noted: ?Switch from alprazolam to lorazepam bc hangover.  Doing ok with lorazepam 1mg  HS but some trouble falling to sleep.   ?Takes propranolol now 60 mg per neuro for tremor.   ?Starts fitness program and diet. ?Not working bc caring for father.  He's about the same with dementia. ?Doing well overall.  Fitness program helped and lost 21#, no sugar and no dairy.   E2M program. ?Patient reports stable mood and denies depressed or irritable moods.  Patient denies any recent difficulty with anxiety.  Patient denies difficulty with sleep initiation or maintenance. Getting  7-8 hours of sleep.Denies appetite disturbance.  Patient reports that energy and motivation have been good.  Patient denies any difficulty with concentration.  Patient denies any suicidal ideation. ? ?Past Psychiatric Medication Trials: Propranolol 20 mg twice daily,  ?Wellbutrin XL,  ?lamotrigine 200,  ?sertraline, duloxetine, paroxetine, venlafaxine worsened anxiety,  ?Abilify, lithium weight gain,  ?Xanax hangover, lorazepam ? ?Review of Systems:  ?Review of Systems  ?Cardiovascular:  Negative for chest pain and palpitations.  ?Neurological:  Negative for dizziness, tremors and weakness.  ?Psychiatric/Behavioral:  Negative for agitation, behavioral problems, confusion, decreased  concentration, dysphoric mood, hallucinations, self-injury, sleep disturbance and suicidal ideas. The patient is not hyperactive.   ? ?Medications: I have reviewed the patient's current medications. ? ?Current Outpatient Medications  ?Medication Sig Dispense Refill  ? B Complex-Folic Acid (B COMPLEX-VITAMIN B12 PO) Take 1 tablet by mouth daily.    ? Cholecalciferol (VITAMIN D-3 PO) Take 2 tablets by mouth daily.    ? omeprazole (PRILOSEC) 40 MG capsule Take 1 capsule (40 mg total) by mouth daily. 30 capsule 0  ? propranolol ER (INDERAL LA) 60 MG 24 hr capsule Take 60 mg by mouth daily.    ? valACYclovir (VALTREX) 500 MG tablet Take 500 mg by mouth daily.     ? buPROPion (WELLBUTRIN XL) 150 MG 24 hr tablet Take 1 tablet (150 mg total) by mouth daily. 90 tablet 1  ? lamoTRIgine (LAMICTAL) 100 MG tablet Take 1 tablet (100 mg total) by mouth 2 (two) times daily. 180 tablet 1  ? LORazepam (ATIVAN) 0.5 MG tablet Take 1 tablet (0.5 mg total) by mouth every 8 (eight) hours. 90 tablet 5  ? ?No current facility-administered medications for this visit.  ? ? ?Medication Side Effects: None ? ?Allergies: No Known Allergies ? ?Past Medical History:  ?Diagnosis Date  ? Anxiety   ? Depression   ? takes wellbutrin, lamictal  ? GERD (gastroesophageal reflux disease)   ? no meds  ? ? ?Family History  ?Adopted: Yes  ? ? ?Social History  ? ?Socioeconomic History  ? Marital status: Married  ?  Spouse name: Not on file  ? Number of children: Not on file  ? Years of education: Not on file  ? Highest education level: Not on file  ?Occupational History  ? Not on file  ?Tobacco Use  ? Smoking status: Never  ? Smokeless tobacco: Never  ?Substance and Sexual Activity  ? Alcohol use: Not Currently  ? Drug use: No  ? Sexual activity: Yes  ?Other Topics Concern  ? Not on file  ?Social History Narrative  ? Not on file  ? ?Social Determinants of Health  ? ?Financial Resource Strain: Not on file  ?Food Insecurity: Not on file  ?Transportation Needs:  Not on file  ?Physical Activity: Not on file  ?Stress: Not on file  ?Social Connections: Not on file  ?Intimate Partner Violence: Not on file  ? ? ?Past Medical History, Surgical history, Social history, and Family history were reviewed and updated as appropriate.  ? ?Please see review of systems for further details on the patient's review from today.  ? ?Objective:  ? ?Physical Exam:  ?LMP 02/26/2011  ? ?Physical Exam ?Constitutional:   ?   General: She is not in acute distress. ?   Appearance: Normal appearance. She is well-developed.  ?Musculoskeletal:     ?   General: No deformity.  ?Neurological:  ?   Mental Status: She is alert and oriented to person, place, and time.  ?   Motor: No tremor.  ?   Coordination: Coordination normal.  ?   Gait: Gait normal.  ?  Psychiatric:     ?   Attention and Perception: Attention normal. She does not perceive auditory hallucinations.     ?   Mood and Affect: Mood is not anxious or depressed. Affect is not labile, blunt, angry or inappropriate.     ?   Speech: Speech normal. Speech is not slurred.     ?   Behavior: Behavior normal.     ?   Thought Content: Thought content normal. Thought content is not delusional. Thought content does not include homicidal or suicidal ideation. Thought content does not include suicidal plan.     ?   Cognition and Memory: Cognition normal.     ?   Judgment: Judgment normal.  ?   Comments: Insight intact. ?No auditory or visual hallucinations. No delusions.  ?  ? ? ?Lab Review:  ?No results found for: NA, K, CL, CO2, GLUCOSE, BUN, CREATININE, CALCIUM, PROT, ALBUMIN, AST, ALT, ALKPHOS, BILITOT, GFRNONAA, GFRAA ? ?   ?Component Value Date/Time  ? WBC 5.1 06/07/2011 0918  ? RBC 4.22 06/07/2011 0918  ? HGB 13.7 06/07/2011 0918  ? HCT 40.2 06/07/2011 0918  ? PLT 173 06/07/2011 0918  ? MCV 95.3 06/07/2011 0918  ? MCH 32.5 06/07/2011 0918  ? MCHC 34.1 06/07/2011 0918  ? RDW 12.8 06/07/2011 0918  ? ? ?No results found for: POCLITH, LITHIUM  ? ?No results  found for: PHENYTOIN, PHENOBARB, VALPROATE, CBMZ  ? ?Normal recent vitamin D levels ? ?.res ?Assessment: Plan:   ? ?Bitha was seen today for follow-up. ? ?Diagnoses and all orders for this visit: ? ?Bipolar 2 disorder

## 2022-03-10 ENCOUNTER — Ambulatory Visit
Admission: RE | Admit: 2022-03-10 | Discharge: 2022-03-10 | Disposition: A | Discharge status: Home / Self Care | Payer: PRIVATE HEALTH INSURANCE | Source: Ambulatory Visit | Attending: Obstetrics and Gynecology | Admitting: Obstetrics and Gynecology

## 2022-03-10 ENCOUNTER — Ambulatory Visit
Admission: RE | Admit: 2022-03-10 | Discharge: 2022-03-10 | Disposition: A | Discharge status: Home / Self Care | Payer: No Typology Code available for payment source | Source: Ambulatory Visit | Attending: Obstetrics and Gynecology | Admitting: Obstetrics and Gynecology

## 2022-03-10 DIAGNOSIS — N6320 Unspecified lump in the left breast, unspecified quadrant: Secondary | ICD-10-CM

## 2022-04-18 ENCOUNTER — Other Ambulatory Visit: Payer: Self-pay | Admitting: Psychiatry

## 2022-04-18 ENCOUNTER — Telehealth: Payer: Self-pay | Admitting: Psychiatry

## 2022-04-18 DIAGNOSIS — F4001 Agoraphobia with panic disorder: Secondary | ICD-10-CM

## 2022-04-18 DIAGNOSIS — F5105 Insomnia due to other mental disorder: Secondary | ICD-10-CM

## 2022-04-18 DIAGNOSIS — F411 Generalized anxiety disorder: Secondary | ICD-10-CM

## 2022-04-18 MED ORDER — LORAZEPAM 1 MG PO TABS: 0.5000 mg | ORAL_TABLET | Freq: Three times a day (TID) | ORAL | 1 refills | Status: DC

## 2022-04-18 NOTE — Telephone Encounter (Signed)
Please review

## 2022-04-18 NOTE — Telephone Encounter (Signed)
Sent new RX.

## 2022-04-18 NOTE — Telephone Encounter (Signed)
Pt lvm that there is a manufactor recall on the 0.5mg  of the lorazapam. She said that the pharmacy has tried to reach out. She asked if the 1 mg of lorazapam be sent instead. Please give her a call at [phone]. She is out of medicine .

## 2022-06-05 ENCOUNTER — Telehealth: Payer: Self-pay | Admitting: Psychiatry

## 2022-06-05 NOTE — Telephone Encounter (Signed)
Patient said pharmacy had 0.5 mg in stock and she had refills available, so can disregard this message.

## 2022-06-05 NOTE — Telephone Encounter (Signed)
Next visit is 07/10/22. Debra Coleman is requesting a refill on her Lorazepam and per [facility] it is in stock. Pharmacy is:  [id number] - [place], [place] [address]  Phone:  [phone]  Fax:  [phone]

## 2022-07-10 ENCOUNTER — Encounter: Payer: Self-pay | Admitting: Psychiatry

## 2022-07-10 ENCOUNTER — Ambulatory Visit (INDEPENDENT_AMBULATORY_CARE_PROVIDER_SITE_OTHER): Payer: PRIVATE HEALTH INSURANCE | Admitting: Psychiatry

## 2022-07-10 DIAGNOSIS — F5105 Insomnia due to other mental disorder: Secondary | ICD-10-CM

## 2022-07-10 DIAGNOSIS — F411 Generalized anxiety disorder: Secondary | ICD-10-CM | POA: Diagnosis not present

## 2022-07-10 DIAGNOSIS — F4001 Agoraphobia with panic disorder: Secondary | ICD-10-CM | POA: Diagnosis not present

## 2022-07-10 DIAGNOSIS — F3181 Bipolar II disorder: Secondary | ICD-10-CM

## 2022-07-10 MED ORDER — LAMOTRIGINE 100 MG PO TABS: 100.0000 mg | ORAL_TABLET | Freq: Two times a day (BID) | ORAL | 1 refills | Status: DC

## 2022-07-10 MED ORDER — BUPROPION HCL ER (XL) 150 MG PO TB24: 150.0000 mg | ORAL_TABLET | Freq: Every day | ORAL | 1 refills | Status: DC

## 2022-07-10 MED ORDER — LORAZEPAM 1 MG PO TABS: 0.5000 mg | ORAL_TABLET | Freq: Three times a day (TID) | ORAL | 5 refills | Status: DC

## 2022-07-10 NOTE — Progress Notes (Signed)
Debra Coleman [id number] 1966-08-28 56 y.o.  Subjective:   Patient ID:  Debra Coleman is a 56 y.o. (DOB 1966-03-31) female.  Chief Complaint:  Chief Complaint  Patient presents with   Follow-up    Bipolar 2 disorder [facility])   Anxiety    HPI   Debra Coleman presents to the office today for follow-up of mood disorder and worsening anxiety.  Went to ER [facility] September 24, 2018 with panic.  Workup was normal.  Seen September 25, 2018.  She had been having some panic and insomnia and alprazolam was adjusted slightly.  It was suggested she take vitamin D through the winter for her history of seasonal depression.  No other med changes were made.  Been consistent with the meds.  seen March 10, 2019 & Jan 2021.  No meds were changed. Stayed on Welllbutrin 150, lamotrigine 200 mg daily.  05/18/20 appt with following noted: M died suddenly MI FEB and she was caretaker of F with dementia.  She had to move in to care for him and take leave from work. Then got a work from home job.  Increasing work demands and stress. Kids stayiing in their house. Some depression and anxiety is worse. Taking Xanax 0.25 mg at night again.  Can't take in day DT grogginess. Xanax 0.25 at sleep. Still taking propranolol BID. A lot better.  Propranolol helps the palpitations and split the dosage am and pm with good results.  No panic now. Plan no med changes except switched Xanax to Ativan  10/14/2020 appt noted: Better overall.  Not taking Bz daytime but does at night.  Lorazepam less effective for sleep than Xanax but it's OK.   Still dealing with F with dementia. No SE. Bouts of reflux with GI work up.  More bouts of palpitations and lower energy. Plan no med changes  07/07/21 appt noted; Saw neuro for neuropathy and worsening tremors and increased to 40 mg daily. Taking Ativan 0.5 mg 2 tablets for sleep and doesn't work as good as Xanax for sleep. Not working at the moment and wants to switch back to  Xanax.  Not as anxious in the day. No mood swings since here.  D is depressed now.   Patient reports stable mood and denies depressed or irritable moods.  Patient denies any recent difficulty with anxiety.  Patient denies difficulty with sleep initiation or maintenance. Sleep 8-9 hours straight through the night.  Denies appetite disturbance.  Patient reports that motivation have been good.  Patient denies any difficulty with concentration.  Patient denies any suicidal ideation. No sig seasonal depression.  Not crazy about job but function is good. Plan: No med changes except switch BZ back to Xanax 0.25 mg bc better for sleep  01/05/2022 appointment with the following noted: Switch from alprazolam to lorazepam bc hangover.  Doing ok with lorazepam 1mg  HS but some trouble falling to sleep.   Takes propranolol now 60 mg per neuro for tremor.   Starts fitness program and diet. Not working bc caring for father.  He's about the same with dementia. Doing well overall.  Fitness program helped and lost 21#, no sugar and no dairy.   E2M program. Patient reports stable mood and denies depressed or irritable moods.  Patient denies any recent difficulty with anxiety.  Patient denies difficulty with sleep initiation or maintenance. Getting  7-8 hours of sleep.Denies appetite disturbance.  Patient reports that energy and motivation have been good.  Patient denies any difficulty  with concentration.  Patient denies any suicidal ideation. Plan: No med changes  Continue Wellbutrin XL 150, lamotrigine 100 mg BID Switch to lorazepam for less daytime sleepiness and use temporarily with worsening stress was successful but she's not using it daytime now. Ativan 0.25-0.5 mg BID prn anxiety and 0.5-1.0 mg HS for sleep  07/10/22 appt noted: Continues meds.   Doing pretty well without major mood or anxiety episodes.  Summer stressful situations.  Son's friend committed suicide and son having problems with depression anyway  and in counseling.  [name] guilt and blaming himself.  Worries over her son. D also dealing with depression. Pt not sig depressed with brief down mild spells of a couple of weeks. No major anxiety Still trouble gettting to sleep before 130 A.  Not sure about lorazepam.  Can sleep later bc not working now.  Cares for F with demential.  Avg 7 hours of sleep. Not usually drowsy daytime.  Past Psychiatric Medication Trials: Propranolol 20 mg twice daily,  Wellbutrin XL,  lamotrigine 200,  sertraline, duloxetine, paroxetine, venlafaxine worsened anxiety,  Abilify, lithium weight gain,  Xanax hangover, lorazepam  Son & D tx depression.  Review of Systems:  Review of Systems  Cardiovascular:  Negative for chest pain and palpitations.  Gastrointestinal:  Positive for constipation.  Musculoskeletal:  Positive for arthralgias.  Neurological:  Negative for dizziness, tremors and weakness.  Psychiatric/Behavioral:  Positive for sleep disturbance. Negative for agitation, behavioral problems, confusion, decreased concentration, dysphoric mood, hallucinations, self-injury and suicidal ideas. The patient is not hyperactive.     Medications: I have reviewed the patient's current medications.  Current Outpatient Medications  Medication Sig Dispense Refill   B Complex-Folic Acid (B COMPLEX-VITAMIN B12 PO) Take 1 tablet by mouth daily.     buPROPion (WELLBUTRIN XL) 150 MG 24 hr tablet Take 1 tablet (150 mg total) by mouth daily. 90 tablet 1   Cholecalciferol (VITAMIN D-3 PO) Take 2 tablets by mouth daily.     lamoTRIgine (LAMICTAL) 100 MG tablet Take 1 tablet (100 mg total) by mouth 2 (two) times daily. 180 tablet 1   LORazepam (ATIVAN) 1 MG tablet Take 0.5 tablets (0.5 mg total) by mouth every 8 (eight) hours. (Patient taking differently: Take 0.5 mg by mouth every 8 (eight) hours. Takes 2 0.5 mg tabs HS) 45 tablet 1   omeprazole (PRILOSEC) 40 MG capsule Take 1 capsule (40 mg total) by mouth daily. 30  capsule 0   propranolol ER (INDERAL LA) 60 MG 24 hr capsule Take 60 mg by mouth daily.     valACYclovir (VALTREX) 500 MG tablet Take 500 mg by mouth daily.      No current facility-administered medications for this visit.    Medication Side Effects: None  Allergies: No Known Allergies  Past Medical History:  Diagnosis Date   Anxiety    Depression    takes wellbutrin, lamictal   GERD (gastroesophageal reflux disease)    no meds    Family History  Adopted: Yes    Social History   Socioeconomic History   Marital status: Married    Spouse name: Not on file   Number of children: Not on file   Years of education: Not on file   Highest education level: Not on file  Occupational History   Not on file  Tobacco Use   Smoking status: Never   Smokeless tobacco: Never  Substance and Sexual Activity   Alcohol use: Not Currently   Drug use: No  Sexual activity: Yes  Other Topics Concern   Not on file  Social History Narrative   Not on file   Social Determinants of Health   Financial Resource Strain: Not on file  Food Insecurity: Not on file  Transportation Needs: Not on file  Physical Activity: Not on file  Stress: Not on file  Social Connections: Not on file  Intimate Partner Violence: Not on file    Past Medical History, Surgical history, Social history, and Family history were reviewed and updated as appropriate.   Please see review of systems for further details on the patient's review from today.   Objective:   Physical Exam:  LMP 02/26/2011   Physical Exam Constitutional:      General: She is not in acute distress.    Appearance: Normal appearance. She is well-developed.  Musculoskeletal:        General: No deformity.  Neurological:     Mental Status: She is alert and oriented to person, place, and time.     Motor: No tremor.     Coordination: Coordination normal.     Gait: Gait normal.  Psychiatric:        Attention and Perception: Attention  normal. She does not perceive auditory hallucinations.        Mood and Affect: Mood is not anxious or depressed. Affect is not labile, blunt, angry or inappropriate.        Speech: Speech normal. Speech is not rapid and pressured or slurred.        Behavior: Behavior normal.        Thought Content: Thought content normal. Thought content is not delusional. Thought content does not include homicidal or suicidal ideation. Thought content does not include suicidal plan.        Cognition and Memory: Cognition normal.        Judgment: Judgment normal.     Comments: Insight intact. No auditory or visual hallucinations. No delusions.       Lab Review:  No results found for: "NA", "K", "CL", "CO2", "GLUCOSE", "BUN", "CREATININE", "CALCIUM", "PROT", "ALBUMIN", "AST", "ALT", "ALKPHOS", "BILITOT", "GFRNONAA", "GFRAA"     Component Value Date/Time   WBC 5.1 06/07/2011 0918   RBC 4.22 06/07/2011 0918   HGB 13.7 06/07/2011 0918   HCT 40.2 06/07/2011 0918   PLT 173 06/07/2011 0918   MCV 95.3 06/07/2011 0918   MCH 32.5 06/07/2011 0918   MCHC 34.1 06/07/2011 0918   RDW 12.8 06/07/2011 0918    No results found for: "POCLITH", "LITHIUM"   No results found for: "PHENYTOIN", "PHENOBARB", "VALPROATE", "CBMZ"   Normal recent vitamin D levels  .res Assessment: Plan:    Cotina was seen today for follow-up and anxiety.  Diagnoses and all orders for this visit:  Bipolar 2 disorder ([place])  Panic disorder with agoraphobia  Generalized anxiety disorder  Insomnia due to mental condition    She had a relapse of panic disorder back in the winter 2020. Also a little seasonal depression X mas 2022 in part DT loss of mother a couple of years ago.  Minor adjustments got her through that crisis and she has been fine since then.  She is not having any significant depression either.  Her insomnia is managed.   The propranolol is helpful for the anxiety and palpitations and tremor and managed by  neuro..  Overall she is done reasonably well on this medication combination since 2010.  She has a long psychiatric history dating back to 77.  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   We discussed the short-term risks associated with benzodiazepines including sedation and increased fall risk among others.  Discussed long-term side effect risk including dependence, potential withdrawal symptoms, and the potential eventual dose-related risk of dementia.  But recent studies from 2020 dispute this association between benzodiazepines and dementia risk. Newer studies in 2020 do not support an association with dementia.  Switch to lorazepam for less daytime sleepiness and use temporarily with worsening stress was successful but she's not using it daytime now.  However delayed cycle problems. Less daytimed drowsiness with lorazepam than alprazolam.  Take vitamin D increase to 5000 U daily through the winter off label for seasonal depression  No med changes  Continue Wellbutrin XL 150, lamotrigine 100 mg BID  Ativan 0.25-0.5 mg BID prn anxiety and 0.5-1.0 mg HS for sleep  Educated re Panic disorder.  Suppportive therapy and helping kids with psych problems.  Vitamin D levels now normal.  FU 6 mos  Meredith Staggers, MD, DFAPA   Please see After Visit Summary for patient specific instructions.  No future appointments.   No orders of the defined types were placed in this encounter.     -------------------------------

## 2022-12-14 ENCOUNTER — Other Ambulatory Visit: Payer: Self-pay | Admitting: Obstetrics and Gynecology

## 2022-12-14 DIAGNOSIS — N6320 Unspecified lump in the left breast, unspecified quadrant: Secondary | ICD-10-CM

## 2023-01-10 ENCOUNTER — Ambulatory Visit: Payer: PRIVATE HEALTH INSURANCE | Admitting: Psychiatry

## 2023-01-12 IMAGING — MG MM DIGITAL DIAGNOSTIC UNILAT*L* W/ TOMO W/ CAD
4 series · 4 of 12 positions shown · non-contrast
Comparison: Previous exam(s).

CLINICAL DATA: 55-year-old female presenting for short-term
follow-up of a probably benign left breast mass.

EXAM:
DIGITAL DIAGNOSTIC UNILATERAL LEFT MAMMOGRAM WITH TOMOSYNTHESIS AND
CAD; ULTRASOUND LEFT BREAST LIMITED
TECHNIQUE: Left digital diagnostic mammography and breast tomosynthesis was
performed. The images were evaluated with computer-aided detection.;
Targeted ultrasound examination of the left breast was performed.

[L CC synth-2D]
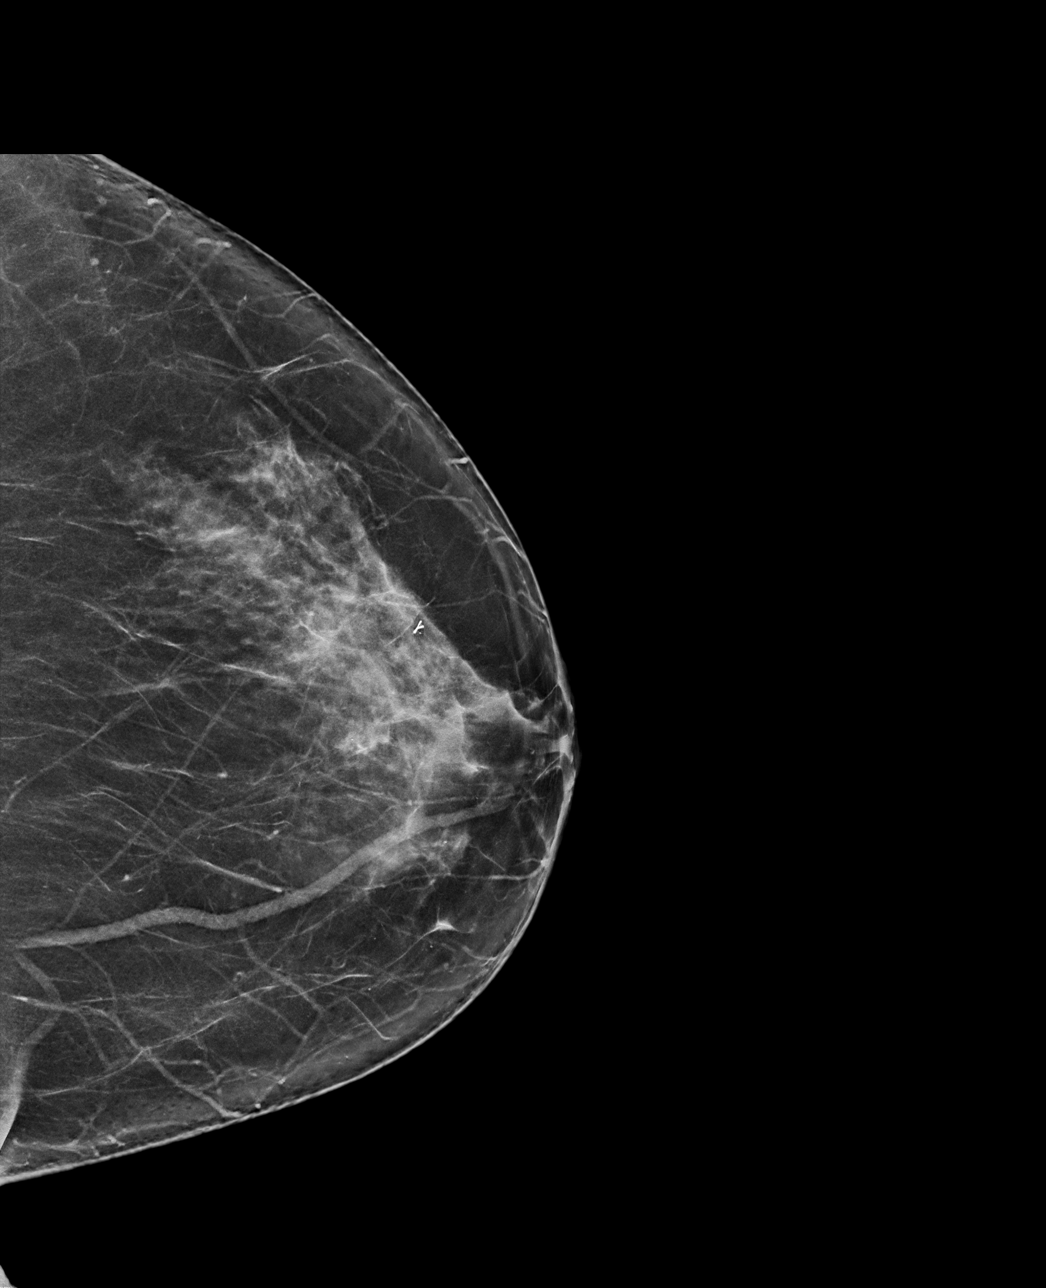

[L MLO synth-2D]
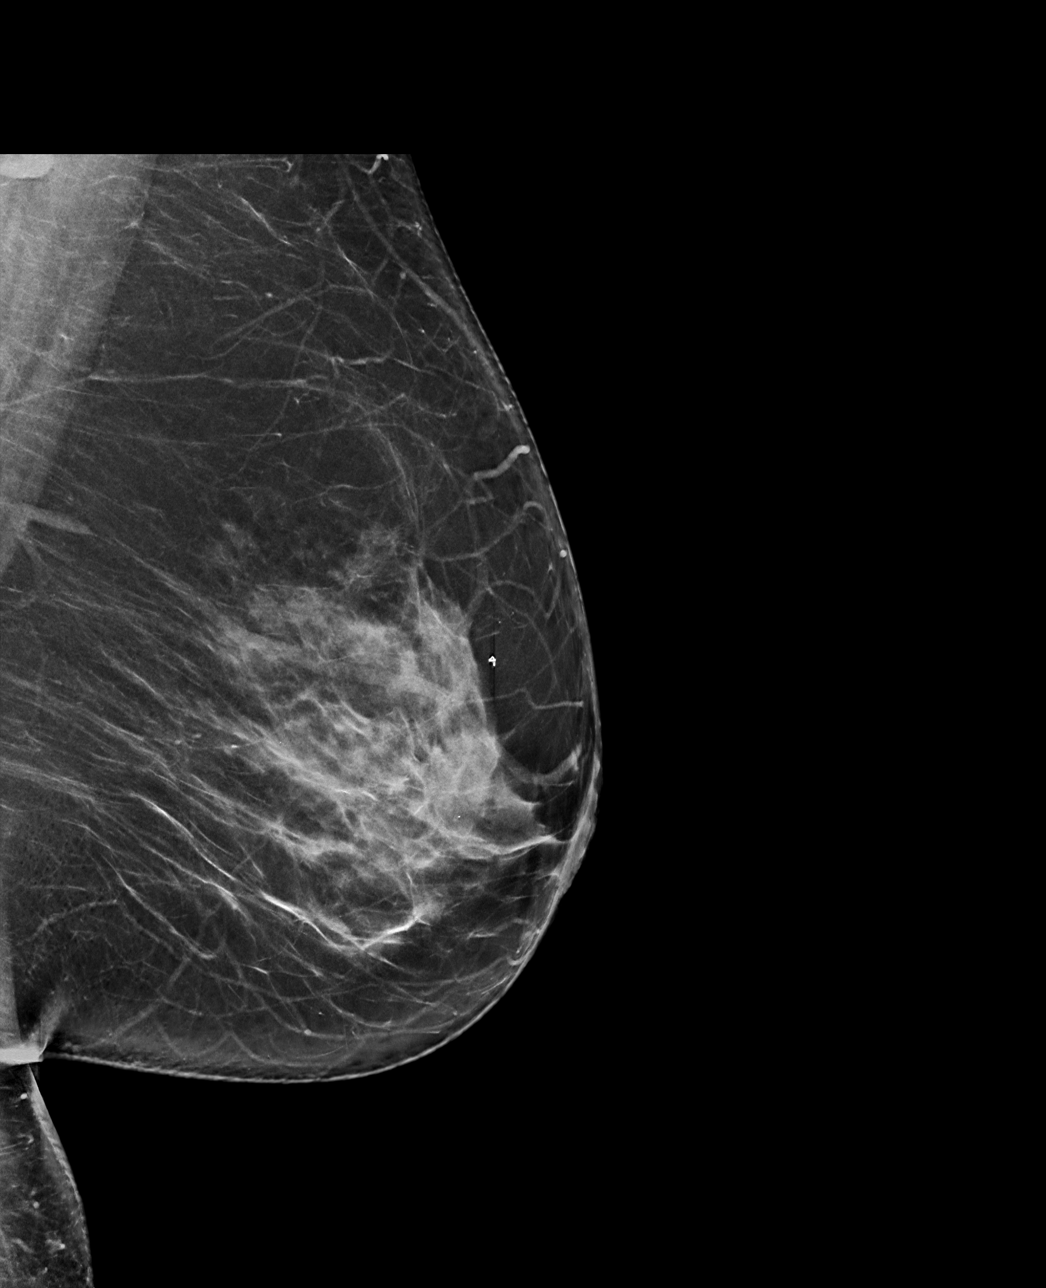

[L CC tomo · tomo slice 38/75.0]
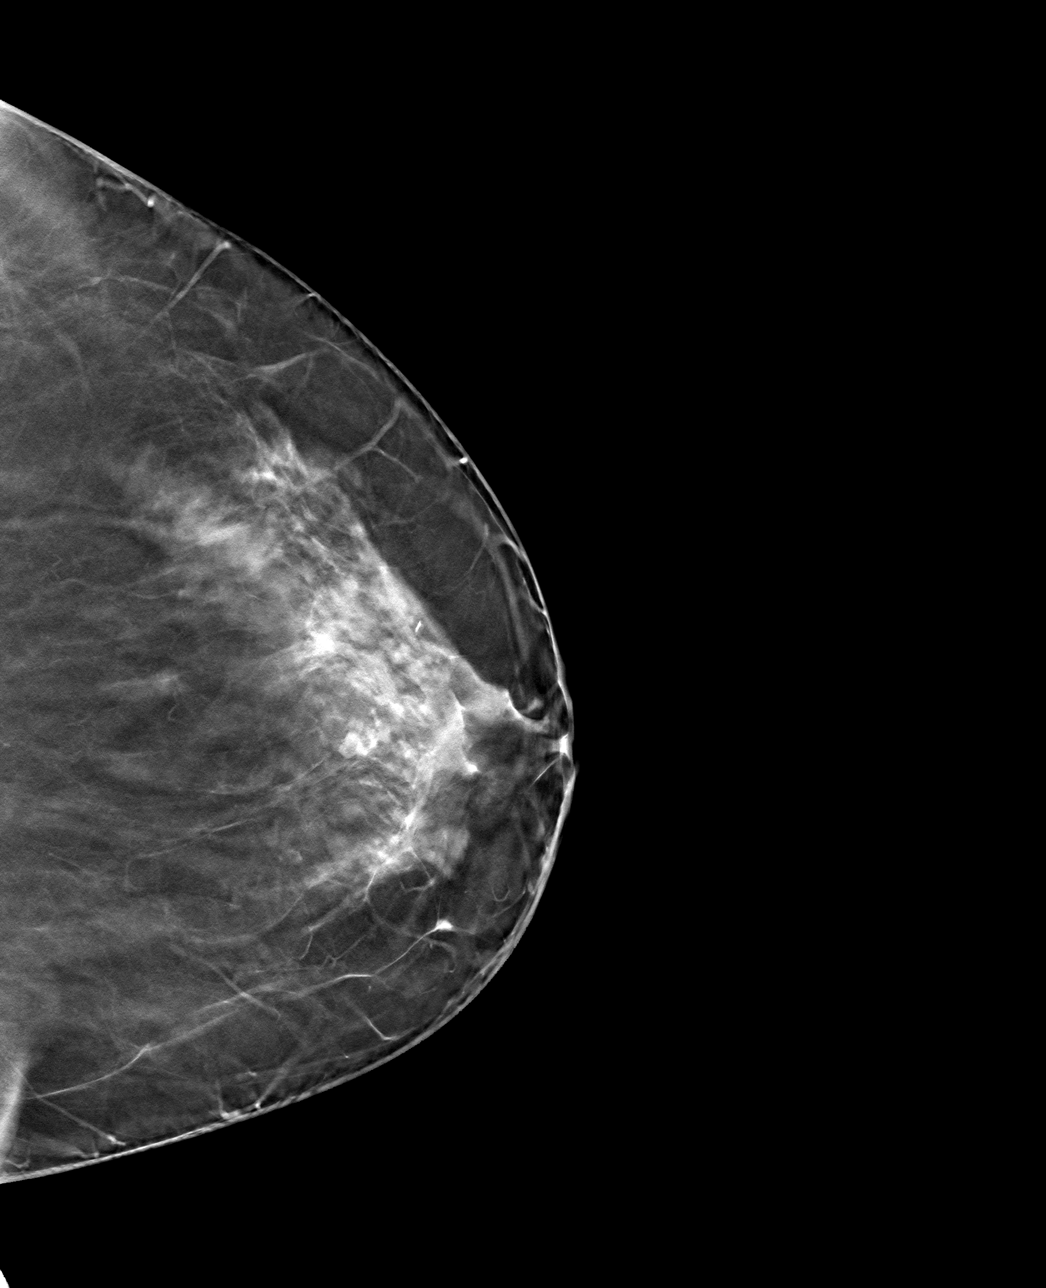

[L MLO tomo · tomo slice 41/80.0]
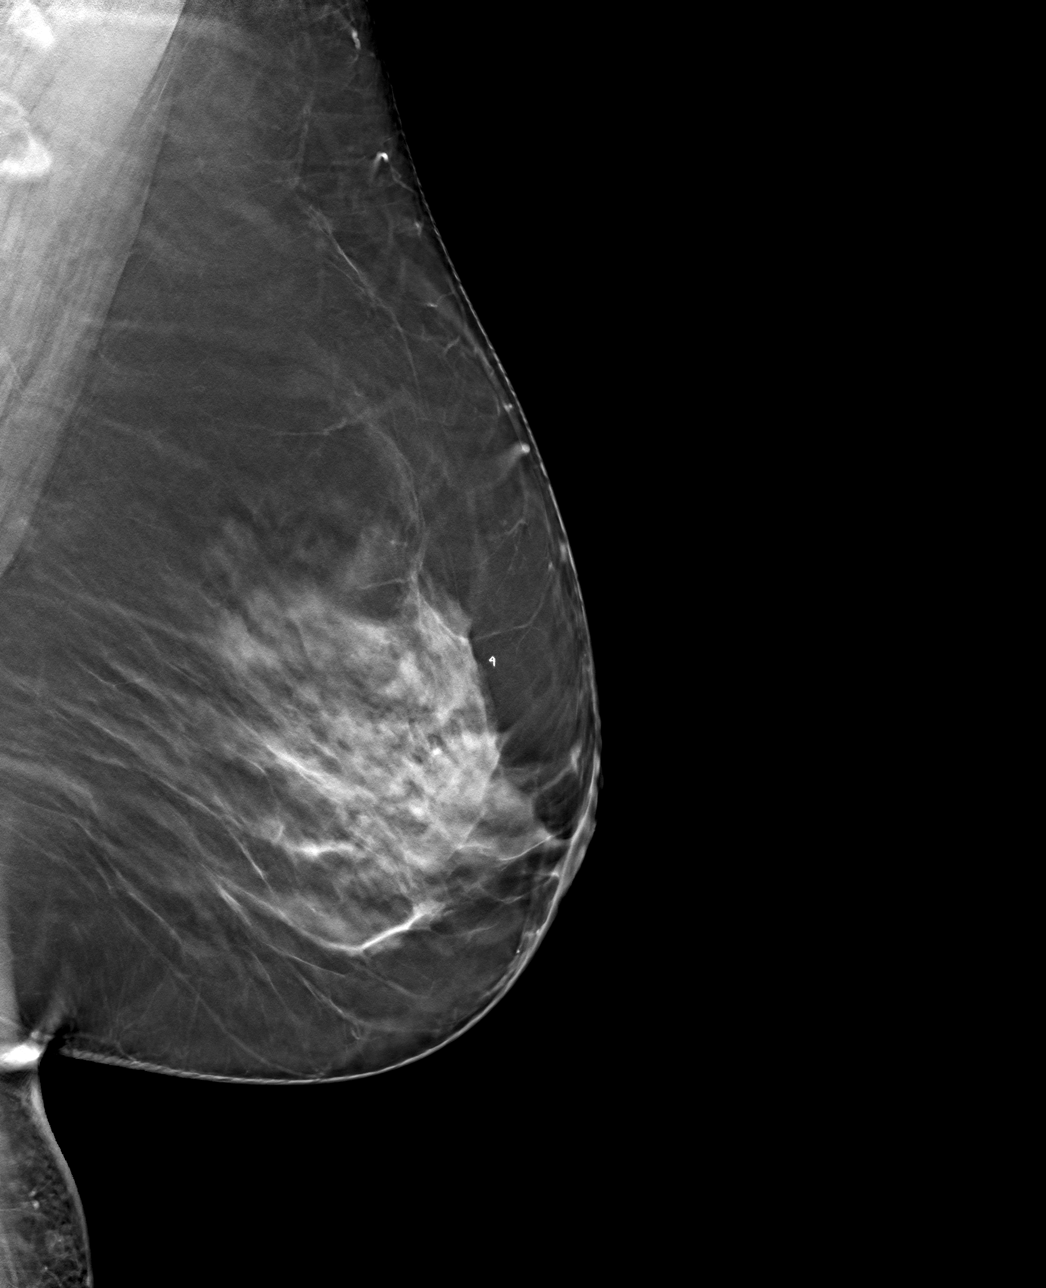

[4 of 12 positions shown; findings below may reference images not displayed]

ACR Breast Density Category c: The breast tissue is heterogeneously
dense, which may obscure small masses.
FINDINGS: Mammogram:

Full field tomosynthesis views of the left breast were performed.
There is a stable small mass in the superior central left breast
anterior depth. There are no new suspicious findings elsewhere.

Ultrasound:

Targeted ultrasound performed in the left breast at 12 o'clock 2 cm
from the nipple demonstrating a hypoechoic mass measuring 1.0 x
x 0.6 cm, previously measuring 1.2 x 1.0 x 1.3 cm, differences in
measurement are felt to be technique related.
IMPRESSION: Stable probably benign left breast mass at 12 o'clock.

RECOMMENDATION:
Diagnostic bilateral mammogram and possible left breast ultrasound
in 6 months.

I have discussed the findings and recommendations with the patient.
If applicable, a reminder letter will be sent to the patient
regarding the next appointment.

BI-RADS CATEGORY  3: Probably benign.

## 2023-01-19 ENCOUNTER — Encounter: Payer: Self-pay | Admitting: Psychiatry

## 2023-01-19 ENCOUNTER — Ambulatory Visit (INDEPENDENT_AMBULATORY_CARE_PROVIDER_SITE_OTHER): Payer: PRIVATE HEALTH INSURANCE | Admitting: Psychiatry

## 2023-01-19 DIAGNOSIS — F5105 Insomnia due to other mental disorder: Secondary | ICD-10-CM | POA: Diagnosis not present

## 2023-01-19 DIAGNOSIS — F411 Generalized anxiety disorder: Secondary | ICD-10-CM | POA: Diagnosis not present

## 2023-01-19 DIAGNOSIS — F3181 Bipolar II disorder: Secondary | ICD-10-CM | POA: Diagnosis not present

## 2023-01-19 DIAGNOSIS — F4001 Agoraphobia with panic disorder: Secondary | ICD-10-CM | POA: Diagnosis not present

## 2023-01-19 DIAGNOSIS — R7989 Other specified abnormal findings of blood chemistry: Secondary | ICD-10-CM

## 2023-01-19 MED ORDER — BUPROPION HCL ER (XL) 300 MG PO TB24: 300.0000 mg | ORAL_TABLET | Freq: Every day | ORAL | 0 refills | Status: DC

## 2023-01-19 NOTE — Progress Notes (Signed)
Debra Coleman [id number] 1966-08-23 57 y.o.  Subjective:   Patient ID:  Debra Coleman is a 57 y.o. (DOB September 27, 1965) female.  Chief Complaint:  Chief Complaint  Patient presents with   Follow-up   Depression   Anxiety    HPI   Debra Coleman presents to the office today for follow-up of mood disorder and worsening anxiety.  Went to ER [facility] September 24, 2018 with panic.  Workup was normal.  Seen September 25, 2018.  She had been having some panic and insomnia and alprazolam was adjusted slightly.  It was suggested she take vitamin D through the winter for her history of seasonal depression.  No other med changes were made.  Been consistent with the meds.  seen March 10, 2019 & Jan 2021.  No meds were changed. Stayed on Welllbutrin 150, lamotrigine 200 mg daily.  05/18/20 appt with following noted: M died suddenly MI FEB and she was caretaker of F with dementia.  She had to move in to care for him and take leave from work. Then got a work from home job.  Increasing work demands and stress. Kids stayiing in their house. Some depression and anxiety is worse. Taking Xanax 0.25 mg at night again.  Can't take in day DT grogginess. Xanax 0.25 at sleep. Still taking propranolol BID. A lot better.  Propranolol helps the palpitations and split the dosage am and pm with good results.  No panic now. Plan no med changes except switched Xanax to Ativan  10/14/2020 appt noted: Better overall.  Not taking Bz daytime but does at night.  Lorazepam less effective for sleep than Xanax but it's OK.   Still dealing with F with dementia. No SE. Bouts of reflux with GI work up.  More bouts of palpitations and lower energy. Plan no med changes  07/07/21 appt noted; Saw neuro for neuropathy and worsening tremors and increased to 40 mg daily. Taking Ativan 0.5 mg 2 tablets for sleep and doesn't work as good as Xanax for sleep. Not working at the moment and wants to switch back to Xanax.  Not as  anxious in the day. No mood swings since here.  D is depressed now.   Patient reports stable mood and denies depressed or irritable moods.  Patient denies any recent difficulty with anxiety.  Patient denies difficulty with sleep initiation or maintenance. Sleep 8-9 hours straight through the night.  Denies appetite disturbance.  Patient reports that motivation have been good.  Patient denies any difficulty with concentration.  Patient denies any suicidal ideation. No sig seasonal depression.  Not crazy about job but function is good. Plan: No med changes except switch BZ back to Xanax 0.25 mg bc better for sleep  01/05/2022 appointment with the following noted: Switch from alprazolam to lorazepam bc hangover.  Doing ok with lorazepam 1mg  HS but some trouble falling to sleep.   Takes propranolol now 60 mg per neuro for tremor.   Starts fitness program and diet. Not working bc caring for father.  He's about the same with dementia. Doing well overall.  Fitness program helped and lost 21#, no sugar and no dairy.   E2M program. Patient reports stable mood and denies depressed or irritable moods.  Patient denies any recent difficulty with anxiety.  Patient denies difficulty with sleep initiation or maintenance. Getting  7-8 hours of sleep.Denies appetite disturbance.  Patient reports that energy and motivation have been good.  Patient denies any difficulty with concentration.  Patient  denies any suicidal ideation. Plan: No med changes  Continue Wellbutrin XL 150, lamotrigine 100 mg BID Switch to lorazepam for less daytime sleepiness and use temporarily with worsening stress was successful but she's not using it daytime now. Ativan 0.25-0.5 mg BID prn anxiety and 0.5-1.0 mg HS for sleep  07/10/22 appt noted: Continues meds.   Doing pretty well without major mood or anxiety episodes.  Summer stressful situations.  Son's friend committed suicide and son having problems with depression anyway and in  counseling.  Debra Coleman guilt and blaming himself.  Worries over her son. D also dealing with depression. Pt not sig depressed with brief down mild spells of a couple of weeks. No major anxiety Still trouble gettting to sleep before 130 A.  Not sure about lorazepam.  Can sleep later bc not working now.  Cares for F with demential.  Avg 7 hours of sleep. Not usually drowsy daytime. Plan: No med changes  Continue Wellbutrin XL 150,  lamotrigine 100 mg BID Ativan 0.25-0.5 mg BID prn anxiety and 0.5-1.0 mg HS for sleep Supplement Vit D in winter  01/19/23 appt noted: Takes lorazepam 1.5 mg HS.  Don't sleep well and not sure it helps. Sometimes more anxious than others. More trouble with dep and anxiety this spring for a couple of mos.  No obvious trigger.  $ stress.  Worry over kids a little better.  Son dep but graduating next week.  Cares for F with dementia.  Hard  to get him to bathe. Tends to get stressed out any time takes a trip.  Had a crying period when went on the trip.   Not as motivated to do things.  Not enjoying things like she should.     Past Psychiatric Medication Trials: Propranolol 20 mg twice daily,  Wellbutrin XL,  lamotrigine 200,  sertraline, duloxetine, paroxetine, venlafaxine worsened anxiety,  Abilify,  lithium weight gain,  Xanax hangover, lorazepam  Son & D tx depression.  Review of Systems:  Review of Systems  Cardiovascular:  Negative for chest pain and palpitations.  Gastrointestinal:  Positive for constipation.  Musculoskeletal:  Positive for arthralgias.  Neurological:  Negative for dizziness, tremors and weakness.  Psychiatric/Behavioral:  Positive for dysphoric mood and sleep disturbance. Negative for agitation, behavioral problems, confusion, decreased concentration, hallucinations, self-injury and suicidal ideas. The patient is nervous/anxious. The patient is not hyperactive.     Medications: I have reviewed the patient's current medications.  Current  Outpatient Medications  Medication Sig Dispense Refill   B Complex-Folic Acid (B COMPLEX-VITAMIN B12 PO) Take 1 tablet by mouth daily.     Cholecalciferol (VITAMIN D-3 PO) Take 2 tablets by mouth daily.     lamoTRIgine (LAMICTAL) 100 MG tablet Take 1 tablet (100 mg total) by mouth 2 (two) times daily. 180 tablet 1   LORazepam (ATIVAN) 1 MG tablet Take 0.5 tablets (0.5 mg total) by mouth every 8 (eight) hours. 45 tablet 5   omeprazole (PRILOSEC) 40 MG capsule Take 1 capsule (40 mg total) by mouth daily. 30 capsule 0   propranolol ER (INDERAL LA) 60 MG 24 hr capsule Take 60 mg by mouth daily.     valACYclovir (VALTREX) 500 MG tablet Take 500 mg by mouth daily.      buPROPion (WELLBUTRIN XL) 300 MG 24 hr tablet Take 1 tablet (300 mg total) by mouth daily. 90 tablet 0   No current facility-administered medications for this visit.    Medication Side Effects: None  Allergies: No Known  Allergies  Past Medical History:  Diagnosis Date   Anxiety    Depression    takes wellbutrin, lamictal   GERD (gastroesophageal reflux disease)    no meds    Family History  Adopted: Yes    Social History   Socioeconomic History   Marital status: Married    Spouse name: Not on file   Number of children: Not on file   Years of education: Not on file   Highest education level: Not on file  Occupational History   Not on file  Tobacco Use   Smoking status: Never   Smokeless tobacco: Never  Substance and Sexual Activity   Alcohol use: Not Currently   Drug use: No   Sexual activity: Yes  Other Topics Concern   Not on file  Social History Narrative   Not on file   Social Determinants of Health   Financial Resource Strain: Not on file  Food Insecurity: Not on file  Transportation Needs: Not on file  Physical Activity: Not on file  Stress: Not on file  Social Connections: Not on file  Intimate Partner Violence: Not on file    Past Medical History, Surgical history, Social history, and  Family history were reviewed and updated as appropriate.   Please see review of systems for further details on the patient's review from today.   Objective:   Physical Exam:  LMP 02/26/2011   Physical Exam Constitutional:      General: She is not in acute distress.    Appearance: Normal appearance. She is well-developed.  Musculoskeletal:        General: No deformity.  Neurological:     Mental Status: She is alert and oriented to person, place, and time.     Motor: No tremor.     Coordination: Coordination normal.     Gait: Gait normal.  Psychiatric:        Attention and Perception: Attention normal. She does not perceive auditory hallucinations.        Mood and Affect: Mood is anxious and depressed. Affect is not labile, blunt, angry or inappropriate.        Speech: Speech normal. Speech is not rapid and pressured or slurred.        Behavior: Behavior normal.        Thought Content: Thought content normal. Thought content is not delusional. Thought content does not include homicidal or suicidal ideation. Thought content does not include suicidal plan.        Cognition and Memory: Cognition normal.        Judgment: Judgment normal.     Comments: Insight intact. No auditory or visual hallucinations. No delusions.       Lab Review:  No results found for: "NA", "K", "CL", "CO2", "GLUCOSE", "BUN", "CREATININE", "CALCIUM", "PROT", "ALBUMIN", "AST", "ALT", "ALKPHOS", "BILITOT", "GFRNONAA", "GFRAA"     Component Value Date/Time   WBC 5.1 06/07/2011 0918   RBC 4.22 06/07/2011 0918   HGB 13.7 06/07/2011 0918   HCT 40.2 06/07/2011 0918   PLT 173 06/07/2011 0918   MCV 95.3 06/07/2011 0918   MCH 32.5 06/07/2011 0918   MCHC 34.1 06/07/2011 0918   RDW 12.8 06/07/2011 0918    No results found for: "POCLITH", "LITHIUM"   No results found for: "PHENYTOIN", "PHENOBARB", "VALPROATE", "CBMZ"   Normal recent vitamin D levels  .res Assessment: Plan:    Audriauna was seen today for  follow-up, depression and anxiety.  Diagnoses and all orders for this visit:  Bipolar 2 disorder (HCC) -     buPROPion (WELLBUTRIN XL) 300 MG 24 hr tablet; Take 1 tablet (300 mg total) by mouth daily.  Panic disorder with agoraphobia  Generalized anxiety disorder  Insomnia due to mental condition  Low vitamin D level    She had a relapse of panic disorder back in the winter 2020. Also a little seasonal depression X mas 2022 in part DT loss of mother a couple of years ago. More depressed and anxious lately.  Depression primary Her insomnia is managed.   The propranolol is helpful for the anxiety and palpitations and tremor and managed by neuro..  Overall she is done reasonably well on this medication combination since 2010.  She has a long psychiatric history dating back to 52.  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   We discussed the short-term risks associated with benzodiazepines including sedation and increased fall risk among others.  Discussed long-term side effect risk including dependence, potential withdrawal symptoms, and the potential eventual dose-related risk of dementia.  But recent studies from 2020 dispute this association between benzodiazepines and dementia risk. Newer studies in 2020 do not support an association with dementia.  Switch to lorazepam for less daytime sleepiness and use temporarily with worsening stress was successful but she's not using it daytime now.  However delayed cycle problems. Less daytimed drowsiness with lorazepam than alprazolam. FU sleep  Take vitamin D increase to 5000 U daily through the winter off label for seasonal depression  Option change Wellbutrin to [employer].  She prefers to increase Wellbutrin Increase Wellbutrin XL 300,  lamotrigine 100 mg BID  Ativan 0.25-0.5 mg BID prn anxiety and 0.5-1.0 mg HS for sleep  Educated re  Panic disorder.  Suppportive therapy and helping kids with psych problems.  Vitamin D levels now normal.  FU 2 mos  Meredith Staggers, MD, DFAPA   Please see After Visit Summary for patient specific instructions.  Future Appointments  Date Time Provider Department Center  03/13/2023 10:40 AM [facility] [place] [facility]  03/13/2023 10:50 AM [facility] [place] [facility]     No orders of the defined types were placed in this encounter.     -------------------------------

## 2023-02-02 ENCOUNTER — Other Ambulatory Visit: Payer: Self-pay | Admitting: Psychiatry

## 2023-02-02 DIAGNOSIS — F5105 Insomnia due to other mental disorder: Secondary | ICD-10-CM

## 2023-02-02 DIAGNOSIS — F411 Generalized anxiety disorder: Secondary | ICD-10-CM

## 2023-02-02 DIAGNOSIS — F4001 Agoraphobia with panic disorder: Secondary | ICD-10-CM

## 2023-02-12 ENCOUNTER — Other Ambulatory Visit: Payer: Self-pay | Admitting: Psychiatry

## 2023-02-12 DIAGNOSIS — F3181 Bipolar II disorder: Secondary | ICD-10-CM

## 2023-03-13 ENCOUNTER — Ambulatory Visit
Admission: RE | Admit: 2023-03-13 | Discharge: 2023-03-13 | Disposition: A | Discharge status: Home / Self Care | Payer: PRIVATE HEALTH INSURANCE | Source: Ambulatory Visit | Attending: Obstetrics and Gynecology | Admitting: Obstetrics and Gynecology

## 2023-03-13 DIAGNOSIS — N6320 Unspecified lump in the left breast, unspecified quadrant: Secondary | ICD-10-CM

## 2023-03-18 ENCOUNTER — Other Ambulatory Visit: Payer: Self-pay | Admitting: Psychiatry

## 2023-03-18 DIAGNOSIS — F3181 Bipolar II disorder: Secondary | ICD-10-CM

## 2023-03-27 ENCOUNTER — Ambulatory Visit (INDEPENDENT_AMBULATORY_CARE_PROVIDER_SITE_OTHER): Payer: 59 | Admitting: Psychiatry

## 2023-03-27 ENCOUNTER — Encounter: Payer: Self-pay | Admitting: Psychiatry

## 2023-03-27 DIAGNOSIS — F4001 Agoraphobia with panic disorder: Secondary | ICD-10-CM | POA: Diagnosis not present

## 2023-03-27 DIAGNOSIS — F5105 Insomnia due to other mental disorder: Secondary | ICD-10-CM | POA: Diagnosis not present

## 2023-03-27 DIAGNOSIS — F411 Generalized anxiety disorder: Secondary | ICD-10-CM | POA: Diagnosis not present

## 2023-03-27 DIAGNOSIS — F3181 Bipolar II disorder: Secondary | ICD-10-CM | POA: Diagnosis not present

## 2023-03-27 DIAGNOSIS — R7989 Other specified abnormal findings of blood chemistry: Secondary | ICD-10-CM

## 2023-03-27 MED ORDER — BUPROPION HCL ER (XL) 150 MG PO TB24
150.0000 mg | ORAL_TABLET | Freq: Every day | ORAL | 1 refills | Status: DC
Start: 2023-03-27 — End: 2023-09-18

## 2023-03-27 MED ORDER — LAMOTRIGINE 100 MG PO TABS: 100.0000 mg | ORAL_TABLET | Freq: Two times a day (BID) | ORAL | 1 refills | Status: DC

## 2023-03-27 MED ORDER — LORAZEPAM 1 MG PO TABS
0.5000 mg | ORAL_TABLET | Freq: Three times a day (TID) | ORAL | 2 refills | Status: DC
Start: 2023-03-27 — End: 2023-04-03

## 2023-03-27 NOTE — Progress Notes (Signed)
Debra Coleman [id number] 11-28-1965 57 y.o.  Subjective:   Patient ID:  Debra Coleman is a 57 y.o. (DOB [birth year]) female.  Chief Complaint:  Chief Complaint  Patient presents with   Follow-up   Depression   Anxiety    HPI   Debra Coleman presents to the office today for follow-up of mood disorder and worsening anxiety.  Went to ER Debra Coleman September 24, 2018 with panic.  Workup was normal.  Seen September 25, 2018.  She had been having some panic and insomnia and alprazolam was adjusted slightly.  It was suggested she take vitamin D through the winter for her history of seasonal depression.  No other med changes were made.  Been consistent with the meds.  seen March 10, 2019 & Jan 2021.  No meds were changed. Stayed on Welllbutrin 150, lamotrigine 200 mg daily.  05/18/20 appt with following noted: M died suddenly MI FEB and she was caretaker of F with dementia.  She had to move in to care for him and take leave from work. Then got a work from home job.  Increasing work demands and stress. Kids stayiing in their house. Some depression and anxiety is worse. Taking Xanax 0.25 mg at night again.  Can't take in day DT grogginess. Xanax 0.25 at sleep. Still taking propranolol BID. A lot better.  Propranolol helps the palpitations and split the dosage am and pm with good results.  No panic now. Plan no med changes except switched Xanax to Ativan  10/14/2020 appt noted: Better overall.  Not taking Bz daytime but does at night.  Lorazepam less effective for sleep than Xanax but it's OK.   Still dealing with F with dementia. No SE. Bouts of reflux with GI work up.  More bouts of palpitations and lower energy. Plan no med changes  07/07/21 appt noted; Saw neuro for neuropathy and worsening tremors and increased to 40 mg daily. Taking Ativan 0.5 mg 2 tablets for sleep and doesn't work as good as Xanax for sleep. Not working at the moment and wants to switch back to Xanax.  Not as  anxious in the day. No mood swings since here.  D is depressed now.   Patient reports stable mood and denies depressed or irritable moods.  Patient denies any recent difficulty with anxiety.  Patient denies difficulty with sleep initiation or maintenance. Sleep 8-9 hours straight through the night.  Denies appetite disturbance.  Patient reports that motivation have been good.  Patient denies any difficulty with concentration.  Patient denies any suicidal ideation. No sig seasonal depression.  Not crazy about job but function is good. Plan: No med changes except switch BZ back to Xanax 0.25 mg bc better for sleep  01/05/2022 appointment with the following noted: Switch from alprazolam to lorazepam bc hangover.  Doing ok with lorazepam 1mg  HS but some trouble falling to sleep.   Takes propranolol now 60 mg per neuro for tremor.   Starts fitness program and diet. Not working bc caring for father.  He's about the same with dementia. Doing well overall.  Fitness program helped and lost 21#, no sugar and no dairy.   E2M program. Patient reports stable mood and denies depressed or irritable moods.  Patient denies any recent difficulty with anxiety.  Patient denies difficulty with sleep initiation or maintenance. Getting  7-8 hours of sleep.Denies appetite disturbance.  Patient reports that energy and motivation have been good.  Patient denies any difficulty with concentration.  Patient  denies any suicidal ideation. Plan: No med changes  Continue Wellbutrin XL 150, lamotrigine 100 mg BID Switch to lorazepam for less daytime sleepiness and use temporarily with worsening stress was successful but she's not using it daytime now. Ativan 0.25-0.5 mg BID prn anxiety and 0.5-1.0 mg HS for sleep  07/10/22 appt noted: Continues meds.   Doing pretty well without major mood or anxiety episodes.  Summer stressful situations.  Son's friend committed suicide and son having problems with depression anyway and in  counseling.  Debra Coleman guilt and blaming himself.  Worries over her son. D also dealing with depression. Pt not sig depressed with brief down mild spells of a couple of weeks. No major anxiety Still trouble gettting to sleep before 130 A.  Not sure about lorazepam.  Can sleep later bc not working now.  Cares for F with demential.  Avg 7 hours of sleep. Not usually drowsy daytime. Plan: No med changes  Continue Wellbutrin XL 150,  lamotrigine 100 mg BID Ativan 0.25-0.5 mg BID prn anxiety and 0.5-1.0 mg HS for sleep Supplement Vit D in winter  01/19/23 appt noted: Takes lorazepam 1.5 mg HS.  Don't sleep well and not sure it helps. Sometimes more anxious than others. More trouble with dep and anxiety this spring for a couple of mos.  No obvious trigger.  $ stress.  Worry over kids a little better.  Son dep but graduating next week.  Cares for F with dementia.  Hard  to get him to bathe. Tends to get stressed out any time takes a trip.  Had a crying period when went on the trip.   Not as motivated to do things.  Not enjoying things like she should.   Plan: Option change Wellbutrin to [employer].  She prefers to increase Wellbutrin Increase Wellbutrin XL 300,  lamotrigine 100 mg BID  Ativan 0.25-0.5 mg BID prn anxiety and 0.5-1.0 mg HS for sleep  03/27/23 appt noted:' Increase Wellbutrin 300 for 4 weeks.  SE wired feeling and reduced back to 150 mg daily. It did help with motivation and enjoyment and has remained ok since reduced the dose. Close aunt passed.  Hard but handled it.   F has dementia.  She has caregiver fatigue.  Needs at least respite care. Not real sad but tired.   Keeping F at home but constant question and confusion.   Seeing neuro [facility] about tremor.  Past Psychiatric Medication Trials: Propranolol 20 mg twice daily,  Wellbutrin XL 300 SE wird,  lamotrigine 200,  sertraline, duloxetine, paroxetine, venlafaxine worsened anxiety,  Abilify,  lithium weight gain,   Xanax hangover, lorazepam  Son & D tx depression.  Review of Systems:  Review of Systems  Cardiovascular:  Negative for chest pain and palpitations.  Gastrointestinal:  Positive for constipation.  Musculoskeletal:  Positive for arthralgias.  Neurological:  Negative for dizziness, tremors and weakness.  Psychiatric/Behavioral:  Negative for agitation, behavioral problems, confusion, decreased concentration, dysphoric mood, hallucinations, self-injury, sleep disturbance and suicidal ideas. The patient is nervous/anxious. The patient is not hyperactive.     Medications: I have reviewed the patient's current medications.  Current Outpatient Medications  Medication Sig Dispense Refill   B Complex-Folic Acid (B COMPLEX-VITAMIN B12 PO) Take 1 tablet by mouth daily.     Cholecalciferol (VITAMIN D-3 PO) Take 2 tablets by mouth daily.     omeprazole (PRILOSEC) 40 MG capsule Take 1 capsule (40 mg total) by mouth daily. 30 capsule 0   propranolol  ER (INDERAL LA) 60 MG 24 hr capsule Take 60 mg by mouth daily.     valACYclovir (VALTREX) 500 MG tablet Take 500 mg by mouth daily.      buPROPion (WELLBUTRIN XL) 150 MG 24 hr tablet Take 1 tablet (150 mg total) by mouth daily. 90 tablet 1   lamoTRIgine (LAMICTAL) 100 MG tablet Take 1 tablet (100 mg total) by mouth 2 (two) times daily. 180 tablet 1   LORazepam (ATIVAN) 1 MG tablet Take 0.5 tablets (0.5 mg total) by mouth every 8 (eight) hours. 45 tablet 2   No current facility-administered medications for this visit.    Medication Side Effects: None  Allergies: No Known Allergies  Past Medical History:  Diagnosis Date   Anxiety    Depression    takes wellbutrin, lamictal   GERD (gastroesophageal reflux disease)    no meds    Family History  Adopted: Yes    Social History   Socioeconomic History   Marital status: Married    Spouse name: Not on file   Number of children: Not on file   Years of education: Not on file   Highest  education level: Not on file  Occupational History   Not on file  Tobacco Use   Smoking status: Never   Smokeless tobacco: Never  Substance and Sexual Activity   Alcohol use: Not Currently   Drug use: No   Sexual activity: Yes  Other Topics Concern   Not on file  Social History Narrative   Not on file   Social Determinants of Health   Financial Resource Strain: Not on file  Food Insecurity: Not on file  Transportation Needs: Not on file  Physical Activity: Not on file  Stress: Not on file  Social Connections: Not on file  Intimate Partner Violence: Not on file    Past Medical History, Surgical history, Social history, and Family history were reviewed and updated as appropriate.   Please see review of systems for further details on the patient's review from today.   Objective:   Physical Exam:  LMP 02/26/2011   Physical Exam Constitutional:      General: She is not in acute distress.    Appearance: Normal appearance. She is well-developed.  Musculoskeletal:        General: No deformity.  Neurological:     Mental Status: She is alert and oriented to person, place, and time.     Motor: No tremor.     Coordination: Coordination normal.     Gait: Gait normal.  Psychiatric:        Attention and Perception: Attention normal. She does not perceive auditory hallucinations.        Mood and Affect: Mood is anxious. Mood is not depressed. Affect is not labile, blunt, angry or inappropriate.        Speech: Speech normal. Speech is not rapid and pressured or slurred.        Behavior: Behavior normal.        Thought Content: Thought content normal. Thought content is not delusional. Thought content does not include homicidal or suicidal ideation. Thought content does not include suicidal plan.        Cognition and Memory: Cognition normal.        Judgment: Judgment normal.     Comments: Insight intact. No auditory or visual hallucinations. No delusions.  Less dep than she  was.        Lab Review:  No results found for: "  NA", "K", "CL", "CO2", "GLUCOSE", "BUN", "CREATININE", "CALCIUM", "PROT", "ALBUMIN", "AST", "ALT", "ALKPHOS", "BILITOT", "GFRNONAA", "GFRAA"     Component Value Date/Time   WBC 5.1 06/07/2011 0918   RBC 4.22 06/07/2011 0918   HGB 13.7 06/07/2011 0918   HCT 40.2 06/07/2011 0918   PLT 173 06/07/2011 0918   MCV 95.3 06/07/2011 0918   MCH 32.5 06/07/2011 0918   MCHC 34.1 06/07/2011 0918   RDW 12.8 06/07/2011 0918    No results found for: "POCLITH", "LITHIUM"   No results found for: "PHENYTOIN", "PHENOBARB", "VALPROATE", "CBMZ"   Normal recent vitamin D levels  .res Assessment: Plan:    Ilma was seen today for follow-up, depression and anxiety.  Diagnoses and all orders for this visit:  Bipolar 2 disorder (HCC) -     buPROPion (WELLBUTRIN XL) 150 MG 24 hr tablet; Take 1 tablet (150 mg total) by mouth daily. -     lamoTRIgine (LAMICTAL) 100 MG tablet; Take 1 tablet (100 mg total) by mouth 2 (two) times daily.  Panic disorder with agoraphobia -     LORazepam (ATIVAN) 1 MG tablet; Take 0.5 tablets (0.5 mg total) by mouth every 8 (eight) hours.  Generalized anxiety disorder -     LORazepam (ATIVAN) 1 MG tablet; Take 0.5 tablets (0.5 mg total) by mouth every 8 (eight) hours.  Insomnia due to mental condition -     LORazepam (ATIVAN) 1 MG tablet; Take 0.5 tablets (0.5 mg total) by mouth every 8 (eight) hours.  Low vitamin D level    She had a relapse of panic disorder back in the winter 2020. Also a little seasonal depression X mas 2022 in part DT loss of mother a couple of years ago. Situational depression and anxiety The propranolol is helpful for the anxiety and palpitations and tremor and managed by neuro..  Overall she is done reasonably well on this medication combination since 2010.  She has a long psychiatric history dating back to 47.  She had a history of cycling depression with a strong seasonal depression  component and the diagnosis was changed to bipolar type II in 2009.   We discussed the short-term risks associated with benzodiazepines including sedation and increased fall risk among others.  Discussed long-term side effect risk including dependence, potential withdrawal symptoms, and the potential eventual dose-related risk of dementia.  But recent studies from 2020 dispute this association between benzodiazepines and dementia risk. Newer studies in 2020 do not support an association with dementia.  Switch to lorazepam for less daytime sleepiness and use temporarily with worsening stress was successful but she's not using it daytime now.  However delayed cycle problems. Less daytimed drowsiness with lorazepam than alprazolam.  But not working well. FU sleep still a problem  Take vitamin D increase to 5000 U daily through the winter off label for seasonal depression  Option change Wellbutrin to [employer].  Reduce back to  Wellbutrin XL 150 DT wired on 300 mg daily lamotrigine 100 mg BID  Ativan 0.25-0.5 mg BID prn anxiety and 0.5-1.0 mg HS for sleep  Counseling 20 min: Educated re caregiving F with dementia and stages of dementia and how it progreesses with STM first and then LTM later, learning difficulties, dealing with hygiene px.  Caregiver fatigue with father.   Getting respite. Suppportive therapy and helping kids with psych problems.   Sleep hygiene with initial ins for a couple of hours.  Occ nights without sleep thinking of demands. Don't oversleep bc tends to get  up late contributes to initial insomnia. Caffeine restriction. Option Ambien  Vitamin D levels now normal.  Disc tx of essential tremor.  She's tried propranolol and primidone  FU  3-4  mos  Meredith Staggers, MD, DFAPA   Please see After Visit Summary for patient specific instructions.  No future appointments.    No orders of the defined types were placed in this encounter.      -------------------------------

## 2023-04-03 ENCOUNTER — Other Ambulatory Visit: Payer: Self-pay | Admitting: Psychiatry

## 2023-04-03 DIAGNOSIS — F411 Generalized anxiety disorder: Secondary | ICD-10-CM

## 2023-04-03 DIAGNOSIS — F5105 Insomnia due to other mental disorder: Secondary | ICD-10-CM

## 2023-04-03 DIAGNOSIS — F4001 Agoraphobia with panic disorder: Secondary | ICD-10-CM

## 2023-04-18 ENCOUNTER — Ambulatory Visit: Payer: 59 | Admitting: Professional Counselor

## 2023-05-15 ENCOUNTER — Encounter: Payer: Self-pay | Admitting: Professional Counselor

## 2023-05-15 ENCOUNTER — Ambulatory Visit (INDEPENDENT_AMBULATORY_CARE_PROVIDER_SITE_OTHER): Payer: No Typology Code available for payment source | Admitting: Professional Counselor

## 2023-05-15 DIAGNOSIS — F411 Generalized anxiety disorder: Secondary | ICD-10-CM | POA: Diagnosis not present

## 2023-05-15 DIAGNOSIS — F3181 Bipolar II disorder: Secondary | ICD-10-CM | POA: Diagnosis not present

## 2023-05-15 NOTE — Progress Notes (Unsigned)
[facility] Counselor Initial Adult Exam  Name: Debra Coleman Date: 05/15/2023 MRN: [id number] DOB: 1966/02/27 PCP: Bailey Mech, PA-C  Time spent: 12:18p - 1:17p  Guardian/Payee:  pt    Paperwork requested:  No   Reason for Visit /Presenting Problem: anxiety, depression  Mental Status Exam:    Appearance:   Neat     Behavior:  Appropriate and Sharing  Motor:  Normal  Speech/Language:   Normal Rate  Affect:  Tearful  Mood:  sad  Thought process:  normal  Thought content:    WNL  Sensory/Perceptual disturbances:    WNL  Orientation:  oriented to person, place, time/date, and situation  Attention:  Good  Concentration:  Good  Memory:  WNL  Fund of knowledge:   Good  Insight:    Good  Judgment:   Good  Impulse Control:  Good   Reported Symptoms:   worries, fears, fatigue, sleep concerns, low self esteem, guilt, grief/loss, tearfulness, caregiver strain, stress  Risk Assessment: Danger to Self:  No Self-injurious Behavior: No Danger to Others: No Duty to Warn:no Physical Aggression / Violence:No  Access to Firearms a concern: No  Gang Involvement:No  Patient / guardian was educated about steps to take if suicide or homicide risk level increases between visits: n/a While future psychiatric events cannot be accurately predicted, the patient does not currently require acute inpatient psychiatric care and does not currently meet [facility] involuntary commitment criteria.  Substance Abuse History: Current substance abuse: No     Past Psychiatric History:   Previous psychological history is significant for anxiety, depression, and bipolar, panic disorder Outpatient Providers: yes History of Psych Hospitalization: No  Psychological Testing:  n/a    Abuse History: Victim of n/a    Report needed: No. Victim of Neglect: emotional, prior marriage Perpetrator of  n/a   Witness / Exposure to Domestic Violence: No   Protective Services Involvement: No   Witness to [employer] Violence:  No   Family History:  Family History  Adopted: Yes    Living situation: the patient lives with their family  Sexual Orientation:  Straight  Relationship Status: married               If a parent, number of children / ages: 74 yo dtr, 43 yo son  Lawyer; spouse friends  Surveyor, quantity Stress:  Yes   Income/Employment/Disability:  spouse  Financial planner: No   Educational History: Education: post Engineer, maintenance (IT) work or degree  Religion/Sprituality/World View:    Christian  Any cultural differences that may affect / interfere with treatment:  not applicable   Recreation/Hobbies: reading, painting  Stressors:Financial difficulties   Other: caregiver stress/fatigue, family concerns     Strengths:  Supportive Relationships, Family, Friends, Journalist, newspaper, Able to [employer], and resiliency, kind, sees the best in people, patient   Barriers:  n/a   Legal History: Pending legal issue / charges: The patient has no significant history of legal issues. History of legal issue / charges:  n/a  Medical History/Surgical History:reviewed Past Medical History:  Diagnosis Date   Anxiety    Depression    takes wellbutrin, lamictal   GERD (gastroesophageal reflux disease)    no meds    Past Surgical History:  Procedure Laterality Date   BLADDER SUSPENSION  06/07/2011   Procedure: TRANSVAGINAL TAPE (TVT) PROCEDURE;  Surgeon: Juluis Mire;  Location: [facility];  Service: Gynecology;  Laterality: N/A;  transobturator sling   BREAST BIOPSY Left 07/20/2015  COLONOSCOPY  06-12-10   TUBAL LIGATION     2001/06/12    Medications: Current Outpatient Medications  Medication Sig Dispense Refill   B Complex-Folic Acid (B COMPLEX-VITAMIN B12 PO) Take 1 tablet by mouth daily.     buPROPion (WELLBUTRIN XL) 150 MG 24 hr tablet Take 1 tablet (150 mg total) by mouth daily. 90 tablet 1   Cholecalciferol (VITAMIN D-3 PO) Take 2 tablets by mouth  daily.     lamoTRIgine (LAMICTAL) 100 MG tablet Take 1 tablet (100 mg total) by mouth 2 (two) times daily. 180 tablet 1   LORazepam (ATIVAN) 1 MG tablet TAKE 0.5 TABLETS (0.5 MG TOTAL) BY MOUTH EVERY 8 (EIGHT) HOURS. 45 tablet 1   omeprazole (PRILOSEC) 40 MG capsule Take 1 capsule (40 mg total) by mouth daily. 30 capsule 0   propranolol ER (INDERAL LA) 60 MG 24 hr capsule Take 60 mg by mouth daily.     valACYclovir (VALTREX) 500 MG tablet Take 500 mg by mouth daily.      No current facility-administered medications for this visit.    No Known Allergies  Diagnoses:    ICD-10-CM   1. Bipolar 2 disorder (HCC)  F31.81     2. Generalized anxiety disorder  F41.1      Treatment Provided: Counselor provided person-centered counseling including active listening, affirmation, building of rapport; clinical assessment; facilitation of PHQ9 (3) and GAD7 (7). Pt presented to session identifying hx of bipolar and panic disorder while feeling anxiety symptoms to be most resonant at this time; she reported her medication regimen to be very helpful in mitigating symptoms. Pt expressed significant caregiver stress, her mother having passed away in 06-12-2020 and to be currently living with and caring for her father who has dementia. She reported work hx to be sporadic and currently looking for work. She expressed second marriage to be very supportive to her well-being, however some related family strain. Pt voiced concern for adult son and his life struggles. Pt identified therapeutic goals as desiring to reduce negative outlook, to reduce overthinking, to learn to cope with caregiver fatigue, and to develop general practical coping skills. Pt voiced her devotional studies to be among her strengths, bringing her peace and rest.  Plan of Care: Pt is scheduled for a follow-up; continue process work and developing coping skills.    Gaspar Bidding, [facility]

## 2023-06-05 ENCOUNTER — Encounter: Payer: Self-pay | Admitting: Professional Counselor

## 2023-06-05 ENCOUNTER — Ambulatory Visit: Payer: No Typology Code available for payment source | Admitting: Professional Counselor

## 2023-06-05 DIAGNOSIS — F3181 Bipolar II disorder: Secondary | ICD-10-CM | POA: Diagnosis not present

## 2023-06-05 DIAGNOSIS — F411 Generalized anxiety disorder: Secondary | ICD-10-CM | POA: Diagnosis not present

## 2023-06-05 NOTE — Progress Notes (Signed)
      [facility] Counselor/Therapist Progress Note  Patient ID: Debra Coleman, MRN: [id number],    Date: 06/05/2023  Time Spent: 10:07a - 11:15p   Treatment Type: Individual Therapy  Reported Symptoms: sadness, worries, stress, interpersonal concerns   Mental Status Exam:  Appearance:   Neat     Behavior:  Appropriate and Sharing  Motor:  Normal  Speech/Language:   Clear and Coherent and Normal Rate  Affect:  Appropriate and Congruent  Mood:  normal  Thought process:  normal  Thought content:    WNL  Sensory/Perceptual disturbances:    WNL  Orientation:  oriented to person, place, time/date, and situation  Attention:  Good  Concentration:  Good  Memory:  WNL  Fund of knowledge:   Good  Insight:    Good  Judgment:   Good  Impulse Control:  Good   Risk Assessment: Danger to Self:  No Self-injurious Behavior: No Danger to Others: No Duty to Warn:no Physical Aggression / Violence:No  Access to Firearms a concern: No  Gang Involvement:No   Subjective: Pt presented to session voicing stability in regards to caregiver strain at this time; she identified additional reosuces in place since last session. Pt reported new job and increase in family member contribution helping with financial stress. Pt reported momentum with second job training process also. Counselor and pt discussed treatment plan and pt gave consent. Pt processed experience of marriage and shared regarding hx of relationship, and other significant and impactful relationships across lifespan. Counselor actively listened, affirmed pt, and helped to facilitate insight.  Interventions: Solution-Oriented/Positive Psychology, Humanistic/Existential, and Insight-Oriented  Diagnosis:   ICD-10-CM   1. Bipolar 2 disorder (HCC)  F31.81     2. Generalized anxiety disorder  F41.1       Plan: Pt is scheduled for a follow-up; continue process work and developing coping skills.   Gaspar Bidding,  [facility]

## 2023-06-06 ENCOUNTER — Other Ambulatory Visit: Payer: Self-pay | Admitting: Psychiatry

## 2023-06-06 DIAGNOSIS — F5105 Insomnia due to other mental disorder: Secondary | ICD-10-CM

## 2023-06-06 DIAGNOSIS — F4001 Agoraphobia with panic disorder: Secondary | ICD-10-CM

## 2023-06-06 DIAGNOSIS — F411 Generalized anxiety disorder: Secondary | ICD-10-CM

## 2023-06-21 ENCOUNTER — Ambulatory Visit (INDEPENDENT_AMBULATORY_CARE_PROVIDER_SITE_OTHER): Payer: 59 | Admitting: Professional Counselor

## 2023-06-21 ENCOUNTER — Encounter: Payer: Self-pay | Admitting: Professional Counselor

## 2023-06-21 ENCOUNTER — Other Ambulatory Visit: Payer: Self-pay | Admitting: Psychiatry

## 2023-06-21 DIAGNOSIS — F411 Generalized anxiety disorder: Secondary | ICD-10-CM

## 2023-06-21 DIAGNOSIS — F3181 Bipolar II disorder: Secondary | ICD-10-CM

## 2023-06-21 NOTE — Telephone Encounter (Signed)
Rf not appropriate; per office visit note 03/27/23 "changed from 300 back to 150 dt SE"; rx needs to be changed to 150 mg. Would rf enough until nxt appt 07/09/23 incase of change.

## 2023-06-21 NOTE — Progress Notes (Signed)
      [facility] Counselor/Therapist Progress Note  Patient ID: Debra Coleman, MRN: [id number],    Date: 06/21/2023  Time Spent: 9:12a - 10:09a   Treatment Type: Individual Therapy  Reported Symptoms: low mood, preemptive grief/loss, sadness, worries, preoccupying thoughts, interpersonal concerns   Mental Status Exam:  Appearance:   Neat     Behavior:  Appropriate and Sharing  Motor:  Normal  Speech/Language:   Clear and Coherent and Normal Rate  Affect:  Appropriate and Congruent  Mood:  normal  Thought process:  normal  Thought content:    WNL  Sensory/Perceptual disturbances:    WNL  Orientation:  Sound  Attention:  Good  Concentration:  Good  Memory:  WNL  Fund of knowledge:   Good  Insight:    Good  Judgment:   Good  Impulse Control:  Good   Risk Assessment: Danger to Self:  No Self-injurious Behavior: No Danger to Others: No Duty to Warn:no Physical Aggression / Violence:No  Access to Firearms a concern: No  Gang Involvement:No   Subjective: Pt reported uncle being in hospice and processed her feelings around his decline and pending loss; she reported aunt having passed away earlier in the year as difficult as well, and identified her attachment to these loved ones and the sense of family shrinking as painful. Counselor held space for grief and loss, affirmed pt and normalized feelings and sense of disruption. Pt processed hx of behavior patterns that she identified as knowing to be harmful in variable ways, however she voiced having a hard time restricting and controlling. Counselor offered psychoeducation around cravings and impulses and dopamine, and helped pt facilitate insight around her drivers, cycles, and the gains and losses therein. They discussed interrupting and substitution within her cycles, and therefore the behavior modifications that she could implement while working on internal shifts in thought processes. Pt to spend time until next session noticing  her patterns and impulses and potentially journaling regarding observations.   Interventions: Solution-Oriented/Positive Psychology, Humanistic/Existential, and Insight-Oriented, CBT, Grief Therapy  Diagnosis:   ICD-10-CM   1. Bipolar 2 disorder (HCC)  F31.81     2. Generalized anxiety disorder  F41.1       Plan: Pt is scheduled for a follow-up; continue process work and developing coping skills.   Gaspar Bidding, [facility]

## 2023-06-23 ENCOUNTER — Ambulatory Visit
Admission: RE | Admit: 2023-06-23 | Discharge: 2023-06-23 | Disposition: A | Discharge status: Home / Self Care | Payer: 59 | Source: Ambulatory Visit | Attending: Physician Assistant | Admitting: Physician Assistant

## 2023-06-23 VITALS — BP 114/77 | HR 75 | Temp 98.1°F | Ht 65.0 in | Wt 175.0 lb

## 2023-06-23 DIAGNOSIS — J40 Bronchitis, not specified as acute or chronic: Secondary | ICD-10-CM

## 2023-06-23 DIAGNOSIS — J329 Chronic sinusitis, unspecified: Secondary | ICD-10-CM

## 2023-06-23 MED ORDER — PREDNISONE 20 MG PO TABS: 40.0000 mg | ORAL_TABLET | Freq: Every day | ORAL | 0 refills | Status: AC

## 2023-06-23 MED ORDER — AMOXICILLIN-POT CLAVULANATE 875-125 MG PO TABS: 1.0000 | ORAL_TABLET | Freq: Two times a day (BID) | ORAL | 0 refills | Status: DC

## 2023-06-23 NOTE — ED Triage Notes (Signed)
Patient presents with cough, tested for Covid last week, negative results, husband tested positive and she states she had the same symptoms. Current symptoms are headache and congestion, sore chest from coughing. Cough is now lingering and is worse at night.Treated with Mucinex DM.

## 2023-06-23 NOTE — ED Provider Notes (Signed)
[name]    CSN: [id number] Arrival date & time: 06/23/23  4098      History   Chief Complaint Chief Complaint  Patient presents with   Cough    Entered by patient    HPI Debra Coleman is a 57 y.o. female.   Patient here today for evaluation of cough that has continued since last week.  She states she was tested for COVID last week and it was negative.  She does note that her husband did test positive for COVID.  She reports headache and congestion and sore chest from coughing.  Cough seems to be worse at night.  She has tried Mucinex DM without resolution.  The history is provided by the patient.  Cough Associated symptoms: sore throat   Associated symptoms: no chills, no ear pain, no eye discharge, no fever, no shortness of breath and no wheezing     Past Medical History:  Diagnosis Date   Anxiety    Depression    takes wellbutrin, lamictal   GERD (gastroesophageal reflux disease)    no meds    There are no problems to display for this patient.   Past Surgical History:  Procedure Laterality Date   BLADDER SUSPENSION  06/07/2011   Procedure: TRANSVAGINAL TAPE (TVT) PROCEDURE;  Surgeon: Juluis Mire;  Location: [facility];  Service: Gynecology;  Laterality: N/A;  transobturator sling   BREAST BIOPSY Left 07/20/2015   COLONOSCOPY  2011   TUBAL LIGATION     2002    OB History   No obstetric history on file.      Home Medications    Prior to Admission medications   Medication Sig Start Date End Date Taking? Authorizing Provider  amoxicillin-clavulanate (AUGMENTIN) 875-125 MG tablet Take 1 tablet by mouth every 12 (twelve) hours. 06/23/23  Yes Tomi Bamberger, PA-C  predniSONE (DELTASONE) 20 MG tablet Take 2 tablets (40 mg total) by mouth daily with breakfast for 5 days. 06/23/23 06/28/23 Yes Tomi Bamberger, PA-C  B Complex-Folic Acid (B COMPLEX-VITAMIN B12 PO) Take 1 tablet by mouth daily.    [provider]  buPROPion (WELLBUTRIN  XL) 150 MG 24 hr tablet Take 1 tablet (150 mg total) by mouth daily. 03/27/23   Cottle, Steva Ready., MD  Cholecalciferol (VITAMIN D-3 PO) Take 2 tablets by mouth daily.    [provider]  lamoTRIgine (LAMICTAL) 100 MG tablet Take 1 tablet (100 mg total) by mouth 2 (two) times daily. 03/27/23   Cottle, Steva Ready., MD  LORazepam (ATIVAN) 1 MG tablet TAKE 0.5 TABLETS (0.5 MG TOTAL) BY MOUTH EVERY 8 (EIGHT) HOURS. 06/07/23   Cottle, Steva Ready., MD  omeprazole (PRILOSEC) 40 MG capsule Take 1 capsule (40 mg total) by mouth daily. 04/21/20   Hall-Potvin, [place], PA-C  propranolol ER (INDERAL LA) 60 MG 24 hr capsule Take 60 mg by mouth daily. 10/24/21   [provider]  valACYclovir (VALTREX) 500 MG tablet Take 500 mg by mouth daily.     [provider]    Family History Family History  Adopted: Yes    Social History Social History   Tobacco Use   Smoking status: Never   Smokeless tobacco: Never  Substance Use Topics   Alcohol use: Not Currently   Drug use: No     Allergies   Patient has no known allergies.   Review of Systems Review of Systems  Constitutional:  Negative for chills and fever.  HENT:  Positive for congestion, sinus pressure and sore throat. Negative for ear pain.   Eyes:  Negative for discharge and redness.  Respiratory:  Positive for cough. Negative for shortness of breath and wheezing.   Gastrointestinal:  Negative for abdominal pain, diarrhea, nausea and vomiting.     Physical Exam Triage Vital Signs ED Triage Vitals  Encounter Vitals Group     BP      Systolic BP Percentile      Diastolic BP Percentile      Pulse      Resp      Temp      Temp src      SpO2      Weight      Height      Head Circumference      Peak Flow      Pain Score      Pain Loc      Pain Education      Exclude from Growth Chart    No data found.  Updated Vital Signs BP 114/77 (BP Location: Left Arm)   Pulse 75   Temp 98.1 F (36.7 C) (Oral)   Ht  5\' 5"  (1.651 m)   Wt 175 lb (79.4 kg)   LMP 02/26/2011   SpO2 98%   BMI 29.12 kg/m   Physical Exam Vitals and nursing note reviewed.  Constitutional:      General: She is not in acute distress.    Appearance: Normal appearance. She is not ill-appearing.  HENT:     Head: Normocephalic and atraumatic.     Nose: Congestion present.     Mouth/Throat:     Mouth: Mucous membranes are moist.     Pharynx: No oropharyngeal exudate or posterior oropharyngeal erythema.  Eyes:     Conjunctiva/sclera: Conjunctivae normal.  Cardiovascular:     Rate and Rhythm: Normal rate and regular rhythm.     Heart sounds: Normal heart sounds. No murmur heard. Pulmonary:     Effort: Pulmonary effort is normal. No respiratory distress.     Breath sounds: Normal breath sounds. No wheezing, rhonchi or rales.  Skin:    General: Skin is warm and dry.  Neurological:     Mental Status: She is alert.  Psychiatric:        Mood and Affect: Mood normal.        Thought Content: Thought content normal.      UC Treatments / Results  Labs (all labs ordered are listed, but only abnormal results are displayed) Labs Reviewed - No data to display  EKG   Radiology No results found.  Procedures Procedures (including critical care time)  Medications Ordered in UC Medications - No data to display  Initial Impression / Assessment and Plan / UC Course  I have reviewed the triage vital signs and the nursing notes.  Pertinent labs & imaging results that were available during my care of the patient were reviewed by me and considered in my medical decision making (see chart for details).    Will treat to cover sinobronchitis with steroid burst and Augmentin.  Recommended follow-up if no gradual improvement with any further concerns.  Final Clinical Impressions(s) / UC Diagnoses   Final diagnoses:  Sinobronchitis   Discharge Instructions   None    ED Prescriptions     Medication Sig Dispense Auth.  Provider   predniSONE (DELTASONE) 20 MG tablet Take 2 tablets (40 mg total) by mouth daily with breakfast for 5  days. 10 tablet Tomi Bamberger, PA-C   amoxicillin-clavulanate (AUGMENTIN) 875-125 MG tablet Take 1 tablet by mouth every 12 (twelve) hours. 14 tablet Tomi Bamberger, PA-C      PDMP not reviewed this encounter.   Tomi Bamberger, PA-C 06/26/23 1818

## 2023-07-09 ENCOUNTER — Encounter: Payer: Self-pay | Admitting: Psychiatry

## 2023-07-09 ENCOUNTER — Ambulatory Visit: Payer: 59 | Admitting: Psychiatry

## 2023-07-09 DIAGNOSIS — F5105 Insomnia due to other mental disorder: Secondary | ICD-10-CM | POA: Diagnosis not present

## 2023-07-09 DIAGNOSIS — F4001 Agoraphobia with panic disorder: Secondary | ICD-10-CM

## 2023-07-09 DIAGNOSIS — F411 Generalized anxiety disorder: Secondary | ICD-10-CM

## 2023-07-09 DIAGNOSIS — F3181 Bipolar II disorder: Secondary | ICD-10-CM | POA: Diagnosis not present

## 2023-07-09 NOTE — Progress Notes (Signed)
Debra Coleman [id number] 12/24/1965 57 y.o.  Subjective:   Patient ID:  Debra Coleman is a 57 y.o. (DOB Oct 24, 1965) female.  Chief Complaint:  Chief Complaint  Patient presents with   Follow-up   Depression   Anxiety   Sleeping Problem    HPI   Debra Coleman presents to the office today for follow-up of mood disorder and worsening anxiety.  Went to ER [facility] September 24, 2018 with panic.  Workup was normal.  Seen September 25, 2018.  She had been having some panic and insomnia and alprazolam was adjusted slightly.  It was suggested she take vitamin D through the winter for her history of seasonal depression.  No other med changes were made.  Been consistent with the meds.  seen March 10, 2019 & Jan 2021.  No meds were changed. Stayed on Welllbutrin 150, lamotrigine 200 mg daily.  05/18/20 appt with following noted: M died suddenly MI FEB and she was caretaker of F with dementia.  She had to move in to care for him and take leave from work. Then got a work from home job.  Increasing work demands and stress. Kids stayiing in their house. Some depression and anxiety is worse. Taking Xanax 0.25 mg at night again.  Can't take in day DT grogginess. Xanax 0.25 at sleep. Still taking propranolol BID. A lot better.  Propranolol helps the palpitations and split the dosage am and pm with good results.  No panic now. Plan no med changes except switched Xanax to Ativan  10/14/2020 appt noted: Better overall.  Not taking Bz daytime but does at night.  Lorazepam less effective for sleep than Xanax but it's OK.   Still dealing with F with dementia. No SE. Bouts of reflux with GI work up.  More bouts of palpitations and lower energy. Plan no med changes  07/07/21 appt noted; Saw neuro for neuropathy and worsening tremors and increased to 40 mg daily. Taking Ativan 0.5 mg 2 tablets for sleep and doesn't work as good as Xanax for sleep. Not working at the moment and wants to switch back to  Xanax.  Not as anxious in the day. No mood swings since here.  D is depressed now.   Patient reports stable mood and denies depressed or irritable moods.  Patient denies any recent difficulty with anxiety.  Patient denies difficulty with sleep initiation or maintenance. Sleep 8-9 hours straight through the night.  Denies appetite disturbance.  Patient reports that motivation have been good.  Patient denies any difficulty with concentration.  Patient denies any suicidal ideation. No sig seasonal depression.  Not crazy about job but function is good. Plan: No med changes except switch BZ back to Xanax 0.25 mg bc better for sleep  01/05/2022 appointment with the following noted: Switch from alprazolam to lorazepam bc hangover.  Doing ok with lorazepam 1mg  HS but some trouble falling to sleep.   Takes propranolol now 60 mg per neuro for tremor.   Starts fitness program and diet. Not working bc caring for father.  He's about the same with dementia. Doing well overall.  Fitness program helped and lost 21#, no sugar and no dairy.   E2M program. Patient reports stable mood and denies depressed or irritable moods.  Patient denies any recent difficulty with anxiety.  Patient denies difficulty with sleep initiation or maintenance. Getting  7-8 hours of sleep.Denies appetite disturbance.  Patient reports that energy and motivation have been good.  Patient denies any difficulty  with concentration.  Patient denies any suicidal ideation. Plan: No med changes  Continue Wellbutrin XL 150, lamotrigine 100 mg BID Switch to lorazepam for less daytime sleepiness and use temporarily with worsening stress was successful but she's not using it daytime now. Ativan 0.25-0.5 mg BID prn anxiety and 0.5-1.0 mg HS for sleep  07/10/22 appt noted: Continues meds.   Doing pretty well without major mood or anxiety episodes.  Summer stressful situations.  Son's friend committed suicide and son having problems with depression anyway  and in counseling.  Debra Coleman guilt and blaming himself.  Worries over her son. D also dealing with depression. Pt not sig depressed with brief down mild spells of a couple of weeks. No major anxiety Still trouble gettting to sleep before 130 A.  Not sure about lorazepam.  Can sleep later bc not working now.  Cares for F with demential.  Avg 7 hours of sleep. Not usually drowsy daytime. Plan: No med changes  Continue Wellbutrin XL 150,  lamotrigine 100 mg BID Ativan 0.25-0.5 mg BID prn anxiety and 0.5-1.0 mg HS for sleep Supplement Vit D in winter  01/19/23 appt noted: Takes lorazepam 1.5 mg HS.  Don't sleep well and not sure it helps. Sometimes more anxious than others. More trouble with dep and anxiety this spring for a couple of mos.  No obvious trigger.  $ stress.  Worry over kids a little better.  Son dep but graduating next week.  Cares for F with dementia.  Hard  to get him to bathe. Tends to get stressed out any time takes a trip.  Had a crying period when went on the trip.   Not as motivated to do things.  Not enjoying things like she should.   Plan: Option change Wellbutrin to [employer].  She prefers to increase Wellbutrin Increase Wellbutrin XL 300,  lamotrigine 100 mg BID  Ativan 0.25-0.5 mg BID prn anxiety and 0.5-1.0 mg HS for sleep  03/27/23 appt noted:' Increase Wellbutrin 300 for 4 weeks.  SE wired feeling and reduced back to 150 mg daily. It did help with motivation and enjoyment and has remained ok since reduced the dose. Close aunt passed.  Hard but handled it.   F has dementia.  She has caregiver fatigue.  Needs at least respite care. Not real sad but tired.   Keeping F at home but constant question and confusion.   Seeing neuro [facility] about tremor. Plan: Option change Wellbutrin to [employer].  Reduce back to  Wellbutrin XL 150 DT wired on 300 mg daily lamotrigine 100 mg BID  Ativan 0.25-0.5 mg BID prn anxiety and 0.5-1.0 mg HS for sleep  07/08/23 appt  noted: Covid 4 weeks ago. Recovered except cough. Psych meds: lorazepam 1-1.5 mg HS, lamotrigine 100 mg BID, Wellbutrin XL 150 mg AM Less wired with less Wellbutrin..   Sleep px 2 AM-1030 AM.  Not drowsy during the day. Rare occ of no sleep.   Uncle closets one, passed a week ago.  Stayed with him over his hospital stay. Wants to wean off lorazepam bc minimal effect New job pPT No dep.  Anxiety manageable.  Mood pretty normal. Caring for F and looking into [facility] benefit.s  Past Psychiatric Medication Trials: Propranolol 20 mg twice daily,  Wellbutrin XL 300 SE wired,  sertraline, duloxetine, paroxetine, venlafaxine worsened anxiety,  lamotrigine 200,   Abilify,  lithium weight gain,  Xanax hangover, lorazepam  Son & D tx depression.  Review of Systems:  Review of Systems  Cardiovascular:  Negative for chest pain and palpitations.  Gastrointestinal:  Positive for constipation.  Musculoskeletal:  Positive for arthralgias.  Neurological:  Negative for dizziness and tremors.  Psychiatric/Behavioral:  Negative for agitation, behavioral problems, confusion, decreased concentration, dysphoric mood, hallucinations, self-injury, sleep disturbance and suicidal ideas. The patient is nervous/anxious. The patient is not hyperactive.     Medications: I have reviewed the patient's current medications.  Current Outpatient Medications  Medication Sig Dispense Refill   B Complex-Folic Acid (B COMPLEX-VITAMIN B12 PO) Take 1 tablet by mouth daily.     buPROPion (WELLBUTRIN XL) 150 MG 24 hr tablet Take 1 tablet (150 mg total) by mouth daily. 90 tablet 1   Cholecalciferol (VITAMIN D-3 PO) Take 2 tablets by mouth daily.     lamoTRIgine (LAMICTAL) 100 MG tablet Take 1 tablet (100 mg total) by mouth 2 (two) times daily. 180 tablet 1   LORazepam (ATIVAN) 1 MG tablet TAKE 0.5 TABLETS (0.5 MG TOTAL) BY MOUTH EVERY 8 (EIGHT) HOURS. 45 tablet 1   omeprazole (PRILOSEC) 40 MG capsule Take 1 capsule (40 mg  total) by mouth daily. 30 capsule 0   propranolol ER (INDERAL LA) 60 MG 24 hr capsule Take 60 mg by mouth daily.     valACYclovir (VALTREX) 500 MG tablet Take 500 mg by mouth daily.      No current facility-administered medications for this visit.    Medication Side Effects: None  Allergies: No Known Allergies  Past Medical History:  Diagnosis Date   Anxiety    Depression    takes wellbutrin, lamictal   GERD (gastroesophageal reflux disease)    no meds    Family History  Adopted: Yes    Social History   Socioeconomic History   Marital status: Married    Spouse name: Not on file   Number of children: Not on file   Years of education: Not on file   Highest education level: Not on file  Occupational History   Not on file  Tobacco Use   Smoking status: Never   Smokeless tobacco: Never  Substance and Sexual Activity   Alcohol use: Not Currently   Drug use: No   Sexual activity: Yes  Other Topics Concern   Not on file  Social History Narrative   Not on file   Social Determinants of Health   Financial Resource Strain: Not on file  Food Insecurity: Low Risk  (11/22/2022)   [facility]   Hunger Vital Sign    Worried About Running Out of Food in the Last Year: Never true    Ran Out of Food in the Last Year: Never true  Transportation Needs: No Transportation Needs (11/22/2022)   [facility]   Transportation    In the past 12 months, has lack of reliable transportation kept you from medical appointments, meetings, work or from getting things needed for daily living? : No  Physical Activity: Not on file  Stress: Not on file  Social Connections: Unknown (01/29/2022)   [facility]   Social Network    Social Network: Not on file  Intimate Partner Violence: Unknown (12/21/2021)   [facility]   HITS    Physically Hurt: Not on file    Insult or Talk  Down To: Not on file    Threaten Physical Harm: Not on file    Scream or Curse: Not on  file    Past Medical History, Surgical history, Social history, and Family history were reviewed and updated as appropriate.   Please see review of systems for further details on the patient's review from today.   Objective:   Physical Exam:  LMP 02/26/2011   Physical Exam Constitutional:      General: She is not in acute distress.    Appearance: Normal appearance. She is well-developed.  Musculoskeletal:        General: No deformity.  Neurological:     Mental Status: She is alert and oriented to person, place, and time.     Motor: No tremor.     Coordination: Coordination normal.     Gait: Gait normal.  Psychiatric:        Attention and Perception: Attention normal. She does not perceive auditory hallucinations.        Mood and Affect: Mood is anxious. Mood is not depressed. Affect is not labile, blunt, angry or inappropriate.        Speech: Speech normal. Speech is not rapid and pressured or slurred.        Behavior: Behavior normal.        Thought Content: Thought content normal. Thought content is not delusional. Thought content does not include homicidal or suicidal ideation. Thought content does not include suicidal plan.        Cognition and Memory: Cognition normal.        Judgment: Judgment normal.     Comments: Insight intact. No auditory or visual hallucinations. No delusions.  Less dep and anxious ok now.      Lab Review:  No results found for: "NA", "K", "CL", "CO2", "GLUCOSE", "BUN", "CREATININE", "CALCIUM", "PROT", "ALBUMIN", "AST", "ALT", "ALKPHOS", "BILITOT", "GFRNONAA", "GFRAA"     Component Value Date/Time   WBC 5.1 06/07/2011 0918   RBC 4.22 06/07/2011 0918   HGB 13.7 06/07/2011 0918   HCT 40.2 06/07/2011 0918   PLT 173 06/07/2011 0918   MCV 95.3 06/07/2011 0918   MCH 32.5 06/07/2011 0918   MCHC 34.1 06/07/2011 0918   RDW 12.8 06/07/2011 0918    No results  found for: "POCLITH", "LITHIUM"   No results found for: "PHENYTOIN", "PHENOBARB", "VALPROATE", "CBMZ"   Normal recent vitamin D levels  .res Assessment: Plan:    Debra "Marliss Czar" was seen today for follow-up, depression, anxiety and sleeping problem.  Diagnoses and all orders for this visit:  Bipolar 2 disorder (HCC)  Generalized anxiety disorder  Insomnia due to mental condition  Panic disorder with agoraphobia     She had a relapse of panic disorder back in the winter 2020. Also a little seasonal depression X mas 2022 in part DT loss of mother a couple of years ago. Situational depression and anxiety The propranolol is helpful for the anxiety and palpitations and tremor and managed by neuro..  Overall she is done reasonably well on this medication combination since 2010.  She has a long psychiatric history dating back to 68.  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   We discussed the short-term risks associated with benzodiazepines including sedation and increased fall risk among others.  Discussed long-term side effect risk including dependence, potential withdrawal symptoms, and the potential eventual dose-related risk of dementia.  But recent studies from 2020 dispute this association between benzodiazepines and dementia risk. Newer studies in 2020 do not support an association with dementia.  Switch to lorazepam for less daytime sleepiness and use temporarily with worsening stress was successful but she's not using it daytime now.  However delayed cycle problems. Less daytimed drowsiness with lorazepam than alprazolam.  But not working well. FU sleep still a problem  Take vitamin D increase to 5000 U daily through the winter off label for seasonal depression  Option change Wellbutrin to [employer].  Reduce back to  Wellbutrin XL 150 DT wired on 300 mg daily lamotrigine 100  mg BID Wants to wean Ativan 1.0 mg HS for sleep  Counseling 20 min: Educated re caregiving F with dementia and stages of dementia and how it progreesses with STM first and then LTM later, learning difficulties, dealing with hygiene px.  Caregiver fatigue with father.   Getting respite. Suppportive therapy and helping kids with psych problems.   Sleep hygiene with initial ins for a couple of hours.  Occ nights without sleep thinking of demands. Don't oversleep bc tends to get up late contributes to initial insomnia. Caffeine restriction. Option Ambien  Vitamin D levels now normal.  Disc tx of essential tremor.  She's tried propranolol and primidone  FU 3-4  mos  Meredith Staggers, MD, DFAPA   Please see After Visit Summary for patient specific instructions.  Future Appointments  Date Time Provider Department Center  07/16/2023 11:00 AM Gaspar Bidding [facility] None  07/30/2023 11:00 AM Gaspar Bidding, [facility] None  08/13/2023 11:00 AM Gaspar Bidding, [facility] None  08/27/2023 11:00 AM Gaspar Bidding, [facility] None      No orders of the defined types were placed in this encounter.     -------------------------------

## 2023-07-14 IMAGING — US US BREAST*L* LIMITED INC AXILLA
1 series · 7 of 7 positions shown · non-contrast
Comparison: Previous exam(s).

CLINICAL DATA: Short interval follow-up for probably benign mass in
the LEFT breast.

EXAM:
DIGITAL DIAGNOSTIC BILATERAL MAMMOGRAM WITH TOMOSYNTHESIS AND CAD;
ULTRASOUND LEFT BREAST LIMITED
TECHNIQUE: Bilateral digital diagnostic mammography and breast tomosynthesis
was performed. The images were evaluated with computer-aided
detection.; Targeted ultrasound examination of the left breast was
performed.

[Series 1: us breast*left* limited inc axilla · 0.06mm/px · 7 of 7 slices shown]
[im 1/7]
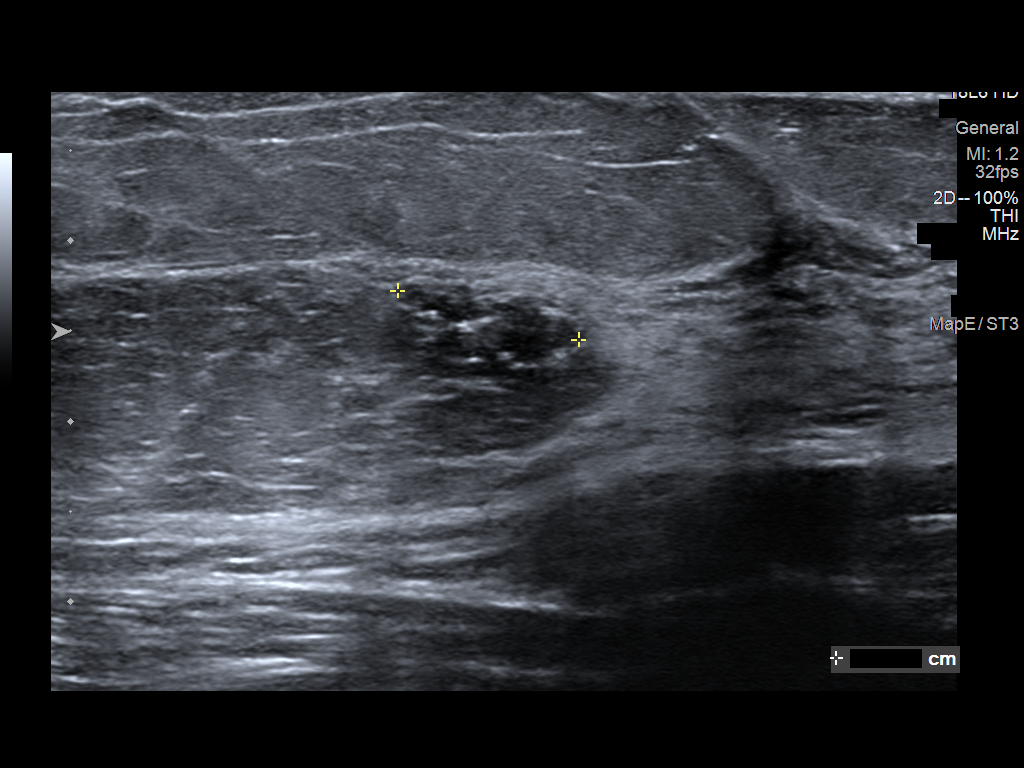
[im 2/7]
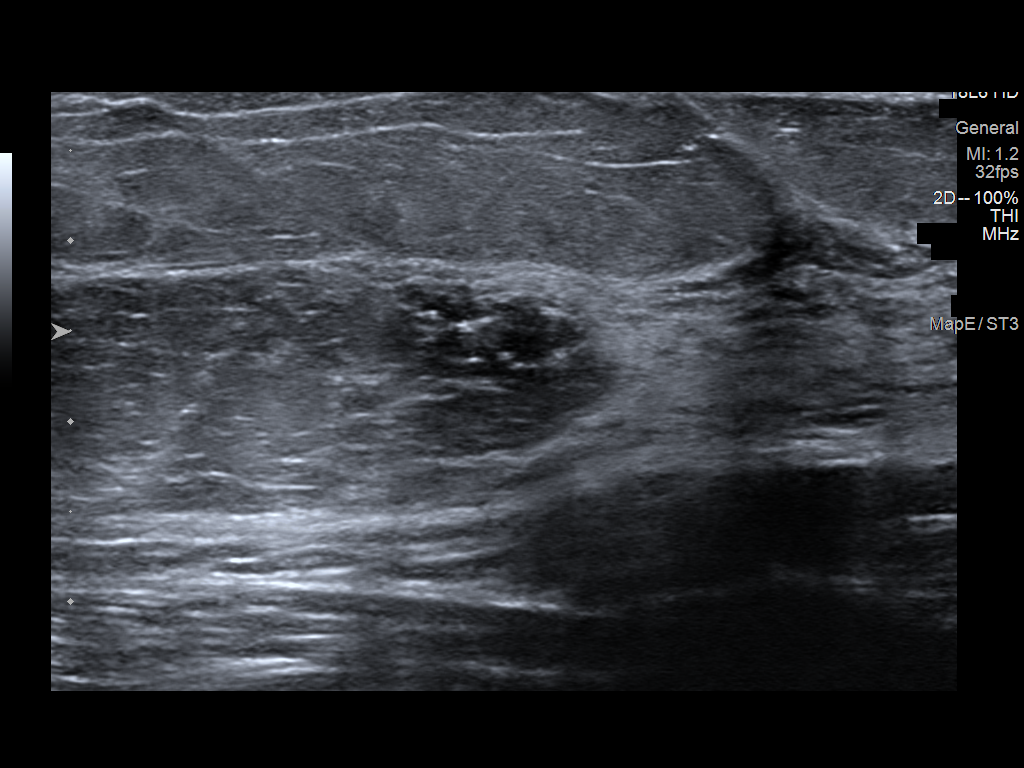
[im 3/7]
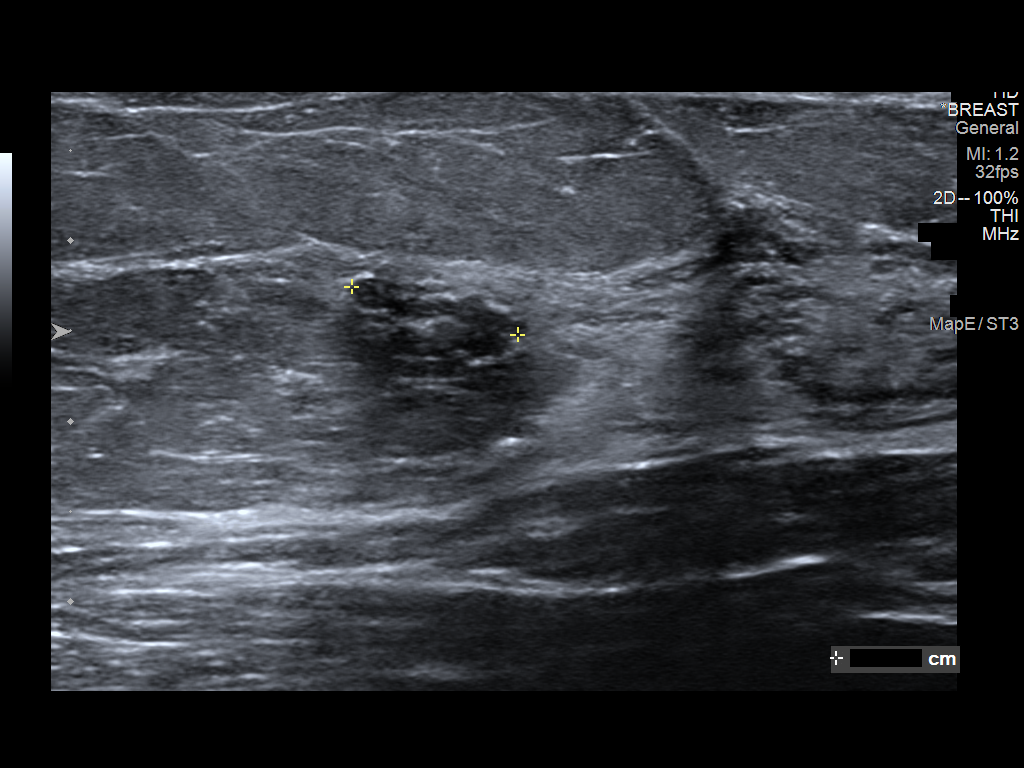
[im 4/7]
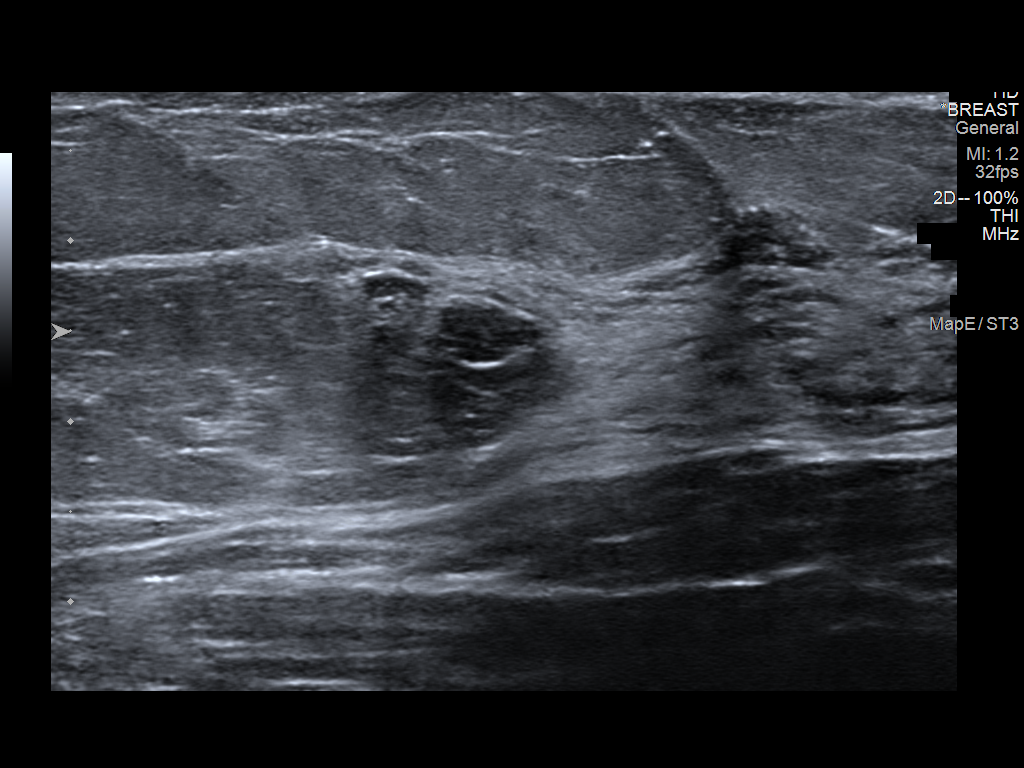
[im 5/7]
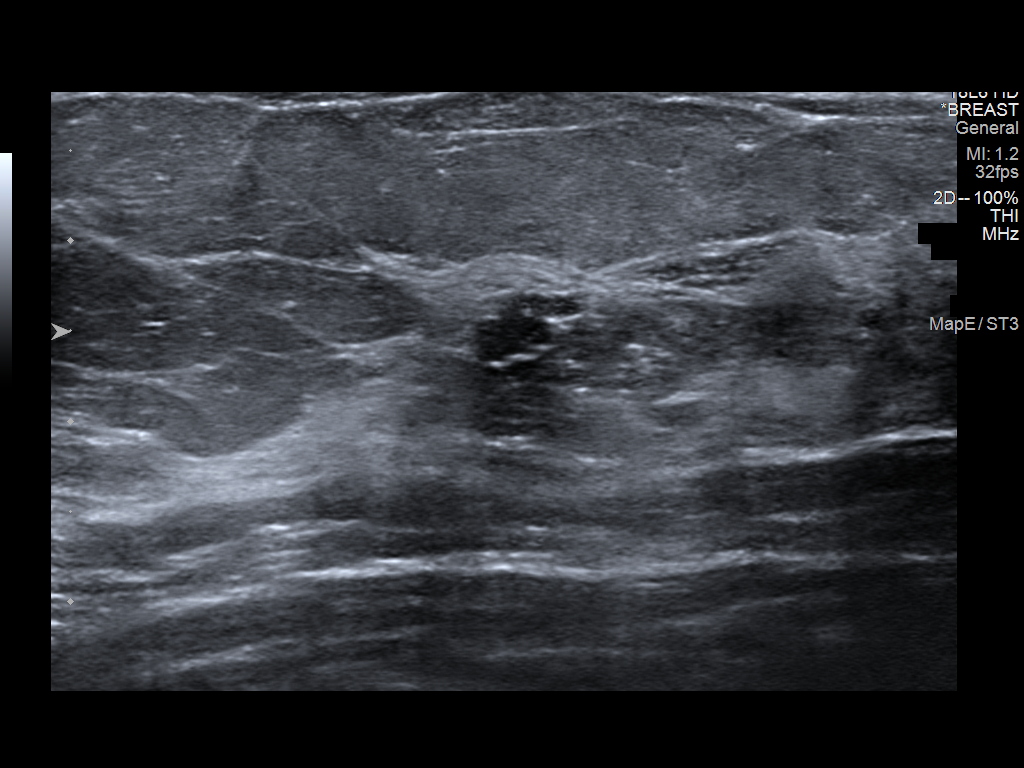
[im 6/7]
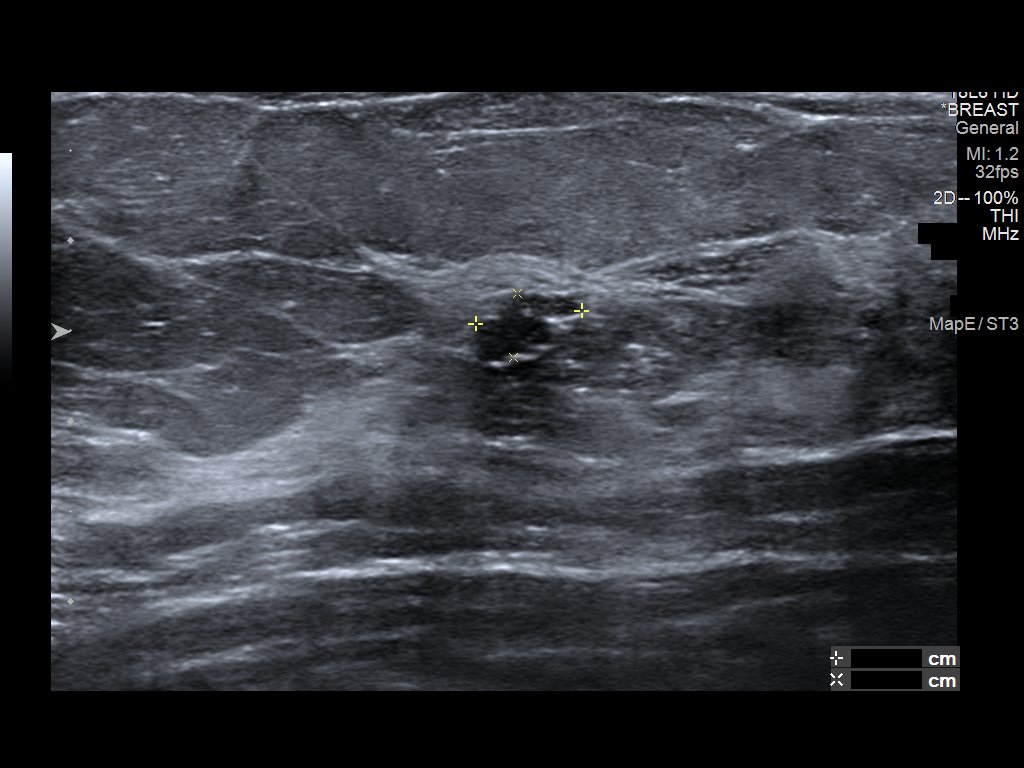
[im 7/7]
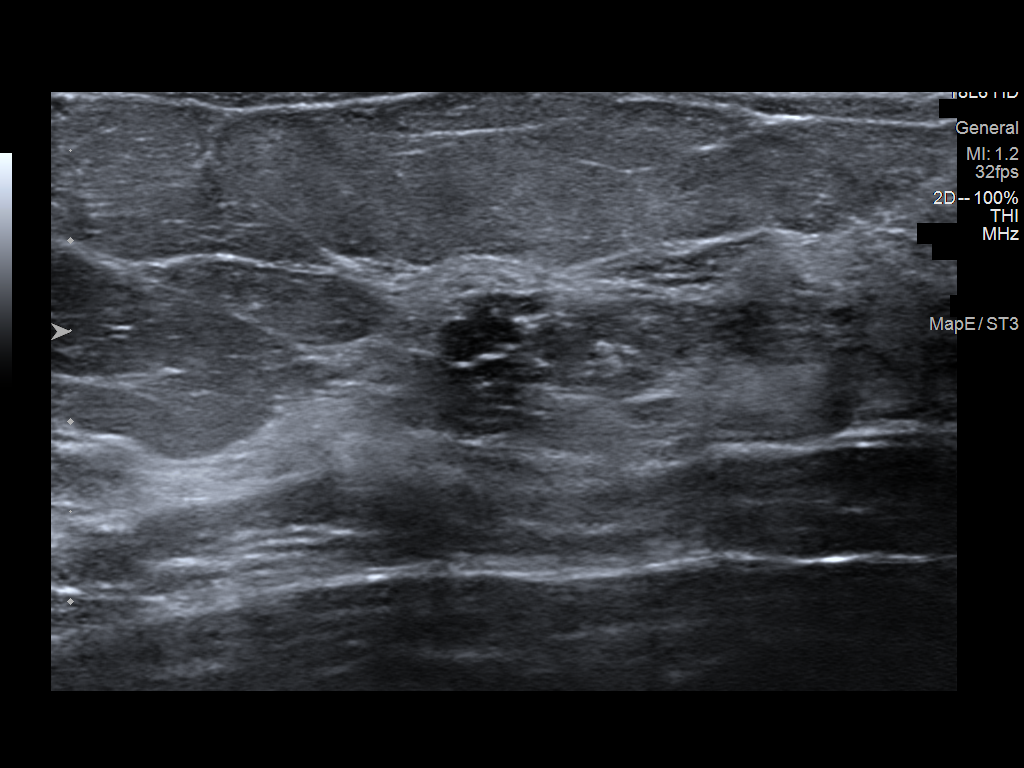

[7 of 7 positions shown; findings below may reference images not displayed]

ACR Breast Density Category c: The breast tissue is heterogeneously
dense, which may obscure small masses.
FINDINGS: No suspicious mass, distortion, or microcalcifications are
identified to suggest presence of malignancy.

Targeted ultrasound is performed, showing a circumscribed parallel
hypoechoic mass in the 12 o'clock location of the LEFT breast
measuring 1.0 x 1.0 x 0.6 centimeters. Mass is stable compared with
prior studies and consistent with benign process. There is no
sonographic evidence for malignancy.
IMPRESSION: Stable, probably benign mass in the 12 o'clock location of the LEFT
breast.

No mammographic or ultrasound evidence for malignancy.

RECOMMENDATION:
Bilateral diagnostic mammogram with LEFT breast ultrasound
recommended in 1 year.

I have discussed the findings and recommendations with the patient.
If applicable, a reminder letter will be sent to the patient
regarding the next appointment.

BI-RADS CATEGORY  3: Probably benign.

## 2023-07-16 ENCOUNTER — Encounter: Payer: Self-pay | Admitting: Professional Counselor

## 2023-07-16 ENCOUNTER — Ambulatory Visit (INDEPENDENT_AMBULATORY_CARE_PROVIDER_SITE_OTHER): Payer: 59 | Admitting: Professional Counselor

## 2023-07-16 DIAGNOSIS — F411 Generalized anxiety disorder: Secondary | ICD-10-CM

## 2023-07-16 DIAGNOSIS — F3181 Bipolar II disorder: Secondary | ICD-10-CM

## 2023-07-16 NOTE — Progress Notes (Signed)
      [facility] Counselor/Therapist Progress Note  Patient ID: Debra Coleman, MRN: [id number],    Date: 07/16/2023  Time Spent: 11:09a- 12:12p   Treatment Type: Individual Therapy  Reported Symptoms: fatigue, restlessness, avoidance, worries, stress, impulsivity, irritability, fears, interpersonal concerns   Mental Status Exam:  Appearance:   Neat     Behavior:  Appropriate, Sharing, and Motivated  Motor:  Normal  Speech/Language:   Clear and Coherent and Normal Rate  Affect:  Appropriate and Congruent  Mood:  normal  Thought process:  normal  Thought content:    WNL  Sensory/Perceptual disturbances:    WNL  Orientation:  Sound  Attention:  Good  Concentration:  Good  Memory:  WNL  Fund of knowledge:   Good  Insight:    Good  Judgment:   Good  Impulse Control:  Good   Risk Assessment: Danger to Self:  No Self-injurious Behavior: No Danger to Others: No Duty to Warn:no Physical Aggression / Violence:No  Access to Firearms a concern: No  Gang Involvement:No   Subjective: Pt presented to session reporting to be feeling sick with a cold. She identified progress since last session in noticing her spending habits (more tempted at night during downtime) which she feels is the way her impulsivity per rapid cycling manifests in a daily way. Counselor assisted with strategies such using wish lists on sites to put items in cart but not buy, allowing pt time to reflect on purchases and satisfy urges in an adaptive way. She also processed experience of other impulsive tendencies and preoccupations, including as relate interpersonal concerns and work needs. For instance pt voiced social preoccupations of potential challenging outcomes, and needing second job but fearing failure, respectively. Utilizing MI counselor helped facilitate insight around pt procrastination of effort that could potentially enhance quality of life, while putting more energy into thinking about choices that  could potentially be disruptive to life. Pt processed family of origin patterns and dynamics and ways these have informed her developmentally; she also processed martial dynamics and patterns that agitate her. Counselor helped to address pt core need for connection, passion and zest for life and ways pt could resource meeting these needs healthily.   Interventions: Solution-Oriented/Positive Psychology, Humanistic/Existential, and Insight-Oriented, MI, CBT  Diagnosis:   ICD-10-CM   1. Bipolar 2 disorder (HCC)  F31.81     2. Generalized anxiety disorder  F41.1       Plan: Pt is scheduled for a follow-up; continue process work and developing coping skills. Pt to practice CBT intervention of thinking through evidence for automatic thoughts of negative nature, and continue to notice and track impulsive urges.   Gaspar Bidding, [facility]

## 2023-07-30 ENCOUNTER — Ambulatory Visit (INDEPENDENT_AMBULATORY_CARE_PROVIDER_SITE_OTHER): Payer: 59 | Admitting: Professional Counselor

## 2023-07-30 ENCOUNTER — Encounter: Payer: Self-pay | Admitting: Professional Counselor

## 2023-07-30 DIAGNOSIS — F411 Generalized anxiety disorder: Secondary | ICD-10-CM | POA: Diagnosis not present

## 2023-07-30 DIAGNOSIS — F3181 Bipolar II disorder: Secondary | ICD-10-CM

## 2023-07-30 NOTE — Progress Notes (Signed)
      [facility] Counselor/Therapist Progress Note  Patient ID: Debra Coleman, MRN: [id number],    Date: 07/30/2023  Time Spent: 11:07a - 12:09p   Treatment Type: Individual Therapy  Reported Symptoms: sleeplessness, tearfulness, worries, impulsivity, sadness, interpersonal concerns, stress  Mental Status Exam:  Appearance:   Neat     Behavior:  Appropriate and Sharing  Motor:  Tremors  Speech/Language:   Clear and Coherent and Normal Rate  Affect:  Appropriate and Congruent  Mood:  normal  Thought process:  normal  Thought content:    WNL  Sensory/Perceptual disturbances:    WNL  Orientation:  Sound  Attention:  Good  Concentration:  Good  Memory:  WNL  Fund of knowledge:   Good  Insight:    Good  Judgment:   Good  Impulse Control:  Good   Risk Assessment: Danger to Self:  No Self-injurious Behavior: No Danger to Others: No Duty to Warn:no Physical Aggression / Violence:No  Access to Firearms a concern: No  Gang Involvement:No   Subjective: Pt presented to session to address rapid cycling symptoms of bipolar, and anxiety. She voiced limited progress with shopping impulsivity and sleeplessness, alongside bouts of tearfulness and sadness. She identified stress in relationship with adult child for whom she worries about and has a hard time maintaining boundaries and expectations. Counselor actively listened and assisted pt in strategies for harm reduction with spending (choosing wish list versus buying in cart so as to pause choice), improved interpersonal communication (firm leadership and observation of roles) and monetary limitations (such as utilizing "instacart" or similar service for grocery contribution to adult child versus providing cash). She and counselor discussed pt increase in self care STG including to resource more exercise engagement.  Interventions: Solution-Oriented/Positive Psychology, Humanistic/Existential, and Insight-Oriented  Diagnosis:    ICD-10-CM   1. Bipolar 2 disorder (HCC)  F31.81     2. Generalized anxiety disorder  F41.1       Plan: Pt is schedule for a follow-up; continue process work and developing coping skills. Pt to work on STG of increase in exercise and implementation of harm reduction, and boundary and expectation strategies where applicable.   Gaspar Bidding, [facility]

## 2023-08-13 ENCOUNTER — Encounter: Payer: Self-pay | Admitting: Professional Counselor

## 2023-08-13 ENCOUNTER — Ambulatory Visit: Payer: 59 | Admitting: Professional Counselor

## 2023-08-13 DIAGNOSIS — F411 Generalized anxiety disorder: Secondary | ICD-10-CM | POA: Diagnosis not present

## 2023-08-13 DIAGNOSIS — F3181 Bipolar II disorder: Secondary | ICD-10-CM

## 2023-08-13 NOTE — Progress Notes (Signed)
      [facility] Counselor/Therapist Progress Note  Patient ID: Kd Boruch, MRN: [id number],    Date: 08/13/2023  Time Spent: 11:15 AM to 12:15 PM  Treatment Type: Individual Therapy  Reported Symptoms: Sleeplessness, worries, guilt, ruminations, interpersonal concerns, stress  Mental Status Exam:  Appearance:   Neat     Behavior:  Appropriate, Sharing, and Motivated  Motor:  Normal  Speech/Language:   Clear and Coherent and Normal Rate  Affect:  Appropriate and Congruent  Mood:  normal  Thought process:  normal  Thought content:    WNL  Sensory/Perceptual disturbances:    WNL  Orientation:  Sound  Attention:  Good  Concentration:  Good  Memory:  WNL  Fund of knowledge:   Good  Insight:    Good  Judgment:   Good  Impulse Control:  Good   Risk Assessment: Danger to Self:  No Self-injurious Behavior: No Danger to Others: No Duty to Warn:no Physical Aggression / Violence:No  Access to Firearms a concern: No  Gang Involvement:No   Subjective: Patient presented to session to address concerns of bipolar and anxiety.  She reported mixed progress at this time.  She voiced working on her shopping impulsivity per CBT techniques as discussed last session, and utilizing distraction from shopping.  She also voiced having talked to her son regarding resources discussed last session,with the conversation to be continued.  Counselor provided psychoeducation and shared resources with patient about the types of boundaries including rigid, porous, and healthy.  Patient identified areas of boundary concern for her, including material and emotional, and she identified a way she could increase boundaries as not saying yes to all requests from others. How others might respond if she placed boundaries she identified as perhaps unwilling at first but over time conciliatory, and as to how her life would change if she placed boundaries, she identified that she would feel less guilt and have more  time for herself.  She and counselor furthermore discussed patient core theme of guilt and issue of self forgiveness.  Patient voiced intention to journal about these topics as short term goal. Patient reflected on her sense that she avoids conflict and does not feel to be assertive in general, which is what she identified as her limitation to greater assertiveness.  She identified that she struggles with emotional identification, expressing her words and not shutting down, interrupting others, and staying on topic per conflict resolution skill discussion with counselor.  Patient positives where conflict resolution are concerned, she identified as: respecting others, ability to take the long view, sharing values, not using bad language or yelling, taking time out, and openness to compromise.  Patient identified short-term goal of practicing conflict resolution and boundary skills as discussed.    Interventions: Assertiveness/Communication, Solution-Oriented/Positive Psychology, Humanistic/Existential, and Insight-Oriented  Diagnosis:   ICD-10-CM   1. Bipolar 2 disorder (HCC)  F31.81     2. Generalized anxiety disorder  F41.1       Plan: Patient is scheduled for follow-up; continue process work and developing coping skills.  Patient to practice boundary skills, conflict resolution skills, and to journal thoughts and feelings as short-term goals between sessions.  Progress note was dictated via Dragon and checked for accuracy.  Gaspar Bidding, [facility]

## 2023-08-27 ENCOUNTER — Ambulatory Visit: Payer: 59 | Admitting: Professional Counselor

## 2023-09-17 ENCOUNTER — Telehealth: Payer: Self-pay | Admitting: Psychiatry

## 2023-09-17 ENCOUNTER — Other Ambulatory Visit: Payer: Self-pay | Admitting: Psychiatry

## 2023-09-17 ENCOUNTER — Other Ambulatory Visit: Payer: Self-pay

## 2023-09-17 DIAGNOSIS — F4001 Agoraphobia with panic disorder: Secondary | ICD-10-CM

## 2023-09-17 DIAGNOSIS — F411 Generalized anxiety disorder: Secondary | ICD-10-CM

## 2023-09-17 DIAGNOSIS — F5105 Insomnia due to other mental disorder: Secondary | ICD-10-CM

## 2023-09-17 MED ORDER — LORAZEPAM 1 MG PO TABS
ORAL_TABLET | ORAL | 1 refills | Status: DC
Start: 2023-09-17 — End: 2023-11-05

## 2023-09-17 NOTE — Telephone Encounter (Signed)
Pt.notified

## 2023-09-17 NOTE — Telephone Encounter (Signed)
Pt lvm that she discontinued the lorazapam and since then has not slept well. She had some left and took them and she is sleeping better, She would like a refill of the  0.5 mg sent to the [facility] on [address]

## 2023-09-17 NOTE — Telephone Encounter (Signed)
Sent RX lorazepam 1 mg

## 2023-09-17 NOTE — Telephone Encounter (Signed)
Debra Coleman states she started taking Lorazepam about 2 weeks ago bc she had been unable to sleep and once she began taking it again she has been able to sleep. She would like an RX for 0,5mg   or 1 MG and she can split it.   Please advise.

## 2023-09-18 ENCOUNTER — Other Ambulatory Visit: Payer: Self-pay | Admitting: Psychiatry

## 2023-09-18 DIAGNOSIS — F3181 Bipolar II disorder: Secondary | ICD-10-CM

## 2023-10-03 ENCOUNTER — Ambulatory Visit: Payer: 59 | Admitting: Professional Counselor

## 2023-10-03 ENCOUNTER — Encounter: Payer: Self-pay | Admitting: Professional Counselor

## 2023-10-03 DIAGNOSIS — F3181 Bipolar II disorder: Secondary | ICD-10-CM

## 2023-10-03 NOTE — Progress Notes (Signed)
      [facility] Counselor/Therapist Progress Note  Patient ID: Maciah Feeback, MRN: [id number],    Date: 10/03/2023  Time Spent: 3:13 PM to 4:13 PM  Treatment Type: Individual Therapy  Reported Symptoms: frustration, low mood, irritability, low motivation, grief/loss, caregiver strain, tearfulness, social anxiety  Mental Status Exam:  Appearance:   Casual     Behavior:  Appropriate and Sharing  Motor:  Normal  Speech/Language:   Clear and Coherent and Normal Rate  Affect:  Depressed and Flat  Mood:  dysthymic  Thought process:  normal  Thought content:    WNL  Sensory/Perceptual disturbances:    WNL  Orientation:  oriented to person, place, time/date, and situation  Attention:  Good  Concentration:  Good  Memory:  WNL  Fund of knowledge:   Good  Insight:    Good  Judgment:   Good  Impulse Control:  Good   Risk Assessment: Danger to Self:  No Self-injurious Behavior: No Danger to Others: No Duty to Warn:no Physical Aggression / Violence:No  Access to Firearms a concern: No  Gang Involvement:No   Subjective: Patient presented to session to address concerns of depression and anxiety.  She reported minimal progress at this time.  She processed experience of her dog being sick, and grief around this circumstance, and processed resonant grief around her mother's loss 4 years ago, and uncle this year, and her father's dementia.  Counselor actively listened, held space for grief processing, normalized and affirmed patient feelings and experience.  She voiced worry for financial stress but also feeling low motivation and finding a new job after quitting her recent part-time 1.  She voiced trying to budget.  Counselor facilitated a goal planning worksheet to help patient focus on personal objectives, and she and patient discussed.  Patient identified goal for the week of getting past mental block related to skill based work she has initiated in the past, to avoid using escape  activities, next month to continue other searches for employment and continue budgeting efforts, next year to consider selling house and increase children's autonomy, 5 years to be maintaining 1 household and to be on track financially, and she identified obstacles as her low motivation, and anxiety for fear of failure, and identified small steps and positive self talk as strategy for alleviating fears.  Interventions: Solution-Oriented/Positive Psychology, Humanistic/Existential, and Insight-Oriented, Grief Therapy  Diagnosis:   ICD-10-CM   1. Bipolar 2 disorder (HCC)  F31.81       Plan: Patient is scheduled for follow-up; continue process work and developing coping skills.  Patient identified short-term goal between sessions to begin momentum pertaining to goal planning objectives as detailed above, implement structure, to increase sense of purpose and autonomy, and prosocial outlets to limit social isolation.  Progress note was dictated with Dragon and reviewed for accuracy.  Gaspar Bidding, [facility]

## 2023-10-13 ENCOUNTER — Other Ambulatory Visit: Payer: Self-pay | Admitting: Psychiatry

## 2023-10-13 DIAGNOSIS — F3181 Bipolar II disorder: Secondary | ICD-10-CM

## 2023-11-05 ENCOUNTER — Ambulatory Visit: Payer: 59 | Admitting: Psychiatry

## 2023-11-05 ENCOUNTER — Encounter: Payer: Self-pay | Admitting: Psychiatry

## 2023-11-05 DIAGNOSIS — F5105 Insomnia due to other mental disorder: Secondary | ICD-10-CM | POA: Diagnosis not present

## 2023-11-05 DIAGNOSIS — F411 Generalized anxiety disorder: Secondary | ICD-10-CM

## 2023-11-05 DIAGNOSIS — F4001 Agoraphobia with panic disorder: Secondary | ICD-10-CM | POA: Diagnosis not present

## 2023-11-05 DIAGNOSIS — F3181 Bipolar II disorder: Secondary | ICD-10-CM

## 2023-11-05 DIAGNOSIS — R7989 Other specified abnormal findings of blood chemistry: Secondary | ICD-10-CM

## 2023-11-05 MED ORDER — LORAZEPAM 1 MG PO TABS
ORAL_TABLET | ORAL | 5 refills | Status: DC
Start: 2023-11-05 — End: 2024-05-07

## 2023-11-05 MED ORDER — BUPROPION HCL ER (XL) 150 MG PO TB24: 150.0000 mg | ORAL_TABLET | Freq: Every day | ORAL | 1 refills | Status: DC

## 2023-11-05 MED ORDER — LAMOTRIGINE 100 MG PO TABS
100.0000 mg | ORAL_TABLET | Freq: Two times a day (BID) | ORAL | 1 refills | Status: DC
Start: 2023-11-05 — End: 2024-05-07

## 2023-11-05 NOTE — Progress Notes (Signed)
 Debra Coleman [id number] 23-Feb-1966 58 y.o.  Subjective:   Patient ID:  Debra Coleman is a 58 y.o. (DOB 21-Mar-1966) female.  Chief Complaint:  Chief Complaint  Patient presents with   Follow-up   Depression   Anxiety   Sleeping Problem    HPI   Debra Coleman presents to the office today for follow-up of mood disorder and worsening anxiety.  Went to ER [facility] September 24, 2018 with panic.  Workup was normal.  Seen September 25, 2018.  She had been having some panic and insomnia and alprazolam was adjusted slightly.  It was suggested she take vitamin D through the winter for her history of seasonal depression.  No other med changes were made.  Been consistent with the meds.  seen March 10, 2019 & Jan 2021.  No meds were changed. Stayed on Welllbutrin 150, lamotrigine 200 mg daily.  05/18/20 appt with following noted: M died suddenly MI FEB and she was caretaker of F with dementia.  She had to move in to care for him and take leave from work. Then got a work from home job.  Increasing work demands and stress. Kids stayiing in their house. Some depression and anxiety is worse. Taking Xanax 0.25 mg at night again.  Can't take in day DT grogginess. Xanax 0.25 at sleep. Still taking propranolol BID. A lot better.  Propranolol helps the palpitations and split the dosage am and pm with good results.  No panic now. Plan no med changes except switched Xanax to Ativan  10/14/2020 appt noted: Better overall.  Not taking Bz daytime but does at night.  Lorazepam less effective for sleep than Xanax but it's OK.   Still dealing with F with dementia. No SE. Bouts of reflux with GI work up.  More bouts of palpitations and lower energy. Plan no med changes  07/07/21 appt noted; Saw neuro for neuropathy and worsening tremors and increased to 40 mg daily. Taking Ativan 0.5 mg 2 tablets for sleep and doesn't work as good as Xanax for sleep. Not working at the moment and wants to switch back to  Xanax.  Not as anxious in the day. No mood swings since here.  D is depressed now.   Patient reports stable mood and denies depressed or irritable moods.  Patient denies any recent difficulty with anxiety.  Patient denies difficulty with sleep initiation or maintenance. Sleep 8-9 hours straight through the night.  Denies appetite disturbance.  Patient reports that motivation have been good.  Patient denies any difficulty with concentration.  Patient denies any suicidal ideation. No sig seasonal depression.  Not crazy about job but function is good. Plan: No med changes except switch BZ back to Xanax 0.25 mg bc better for sleep  01/05/2022 appointment with the following noted: Switch from alprazolam to lorazepam bc hangover.  Doing ok with lorazepam 1mg  HS but some trouble falling to sleep.   Takes propranolol now 60 mg per neuro for tremor.   Starts fitness program and diet. Not working bc caring for father.  He's about the same with dementia. Doing well overall.  Fitness program helped and lost 21#, no sugar and no dairy.   E2M program. Patient reports stable mood and denies depressed or irritable moods.  Patient denies any recent difficulty with anxiety.  Patient denies difficulty with sleep initiation or maintenance. Getting  7-8 hours of sleep.Denies appetite disturbance.  Patient reports that energy and motivation have been good.  Patient denies any difficulty  with concentration.  Patient denies any suicidal ideation. Plan: No med changes  Continue Wellbutrin XL 150, lamotrigine 100 mg BID Switch to lorazepam for less daytime sleepiness and use temporarily with worsening stress was successful but she's not using it daytime now. Ativan 0.25-0.5 mg BID prn anxiety and 0.5-1.0 mg HS for sleep  07/10/22 appt noted: Continues meds.   Doing pretty well without major mood or anxiety episodes.  Summer stressful situations.  Son's friend committed suicide and son having problems with depression anyway  and in counseling.  Debra Coleman guilt and blaming himself.  Worries over her son. D also dealing with depression. Pt not sig depressed with brief down mild spells of a couple of weeks. No major anxiety Still trouble gettting to sleep before 130 A.  Not sure about lorazepam.  Can sleep later bc not working now.  Cares for F with demential.  Avg 7 hours of sleep. Not usually drowsy daytime. Plan: No med changes  Continue Wellbutrin XL 150,  lamotrigine 100 mg BID Ativan 0.25-0.5 mg BID prn anxiety and 0.5-1.0 mg HS for sleep Supplement Vit D in winter  01/19/23 appt noted: Takes lorazepam 1.5 mg HS.  Don't sleep well and not sure it helps. Sometimes more anxious than others. More trouble with dep and anxiety this spring for a couple of mos.  No obvious trigger.  $ stress.  Worry over kids a little better.  Son dep but graduating next week.  Cares for F with dementia.  Hard  to get him to bathe. Tends to get stressed out any time takes a trip.  Had a crying period when went on the trip.   Not as motivated to do things.  Not enjoying things like she should.   Plan: Option change Wellbutrin to [employer].  She prefers to increase Wellbutrin Increase Wellbutrin XL 300,  lamotrigine 100 mg BID  Ativan 0.25-0.5 mg BID prn anxiety and 0.5-1.0 mg HS for sleep  03/27/23 appt noted:' Increase Wellbutrin 300 for 4 weeks.  SE wired feeling and reduced back to 150 mg daily. It did help with motivation and enjoyment and has remained ok since reduced the dose. Close aunt passed.  Hard but handled it.   F has dementia.  She has caregiver fatigue.  Needs at least respite care. Not real sad but tired.   Keeping F at home but constant question and confusion.   Seeing neuro [facility] about tremor. Plan: Option change Wellbutrin to [employer].  Reduce back to  Wellbutrin XL 150 DT wired on 300 mg daily lamotrigine 100 mg BID  Ativan 0.25-0.5 mg BID prn anxiety and 0.5-1.0 mg HS for sleep  07/08/23 appt  noted: Covid 4 weeks ago. Recovered except cough. Psych meds: lorazepam 1-1.5 mg HS, lamotrigine 100 mg BID, Wellbutrin XL 150 mg AM Less wired with less Wellbutrin..   Sleep px 2 AM-1030 AM.  Not drowsy during the day. Rare occ of no sleep.   Uncle closest one, passed a week ago.  Stayed with him over his hospital stay. Wants to wean off lorazepam bc minimal effect New job pPT No dep.  Anxiety manageable.  Mood pretty normal. Caring for F and looking into [place] benefit.s Plan: disc weaning lorazepam  11/05/23 appt noted: Psych meds: lorazepam 1-1.5 mg HS, lamotrigine 100 mg BID, Wellbutrin XL 150 mg AM.  No SE Nothing better off lorazepam for a couple of weeks and sleep was worse so restarted it.  Main px getting to sleep  until 130-200 am but can sleep late.  Split sleep cycle but usually 7-8 hours.   Cares for father FT. Anxiety ok.  A little down with holidays and winter and chronic $ problems.  Might be some light at end of tunnel. 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   Kids doing pretty well lately. Son grad college in Dec and looking for a job. No sig med concerns.   She and H losing their dog.  Past Psychiatric Medication Trials:  Propranolol 20 mg twice daily,  Wellbutrin XL 300 SE wired,  sertraline, duloxetine, paroxetine, venlafaxine worsened anxiety,  lamotrigine 200,   Abilify,  lithium weight gain,  Xanax hangover, lorazepam  Son & D tx depression.  Review of Systems:  Review of Systems  Cardiovascular:  Negative for chest pain and palpitations.  Gastrointestinal:  Positive for constipation.  Musculoskeletal:  Positive for arthralgias.  Neurological:  Negative for dizziness and tremors.  Psychiatric/Behavioral:  Positive for sleep disturbance. Negative for agitation, behavioral problems, confusion, decreased concentration, dysphoric mood, hallucinations, self-injury and suicidal ideas. The patient is nervous/anxious. The patient is not hyperactive.      Medications: I have reviewed the patient's current medications.  Current Outpatient Medications  Medication Sig Dispense Refill   B Complex-Folic Acid (B COMPLEX-VITAMIN B12 PO) Take 1 tablet by mouth daily.     Cholecalciferol (VITAMIN D-3 PO) Take 2 tablets by mouth daily.     omeprazole (PRILOSEC) 40 MG capsule Take 1 capsule (40 mg total) by mouth daily. 30 capsule 0   propranolol ER (INDERAL LA) 60 MG 24 hr capsule Take 60 mg by mouth daily.     valACYclovir (VALTREX) 500 MG tablet Take 500 mg by mouth daily.      buPROPion (WELLBUTRIN XL) 150 MG 24 hr tablet Take 1 tablet (150 mg total) by mouth daily. 90 tablet 1   lamoTRIgine (LAMICTAL) 100 MG tablet Take 1 tablet (100 mg total) by mouth 2 (two) times daily. 180 tablet 1   LORazepam (ATIVAN) 1 MG tablet 1 to 1 and 1/2 tablets at night as needed for sleep 45 tablet 5   No current facility-administered medications for this visit.    Medication Side Effects: None  Allergies: No Known Allergies  Past Medical History:  Diagnosis Date   Anxiety    Depression    takes wellbutrin, lamictal   GERD (gastroesophageal reflux disease)    no meds    Family History  Adopted: Yes    Social History   Socioeconomic History   Marital status: Married    Spouse name: Not on file   Number of children: Not on file   Years of education: Not on file   Highest education level: Not on file  Occupational History   Not on file  Tobacco Use   Smoking status: Never   Smokeless tobacco: Never  Substance and Sexual Activity   Alcohol use: Not Currently   Drug use: No   Sexual activity: Yes  Other Topics Concern   Not on file  Social History Narrative   Not on file   Social Drivers of Health   Financial Resource Strain: Not on file  Food Insecurity: Low Risk  (11/22/2022)   [facility]   Hunger Vital Sign    Worried About Running Out of Food in the Last Year: Never true    Ran Out of Food in the  Last Year: Never true  Transportation Needs: No Transportation Needs (  11/22/2022)   [facility]   Transportation    In the past 12 months, has lack of reliable transportation kept you from medical appointments, meetings, work or from getting things needed for daily living? : No  Physical Activity: Not on file  Stress: Not on file  Social Connections: Unknown (01/29/2022)   [facility]   Social Network    Social Network: Not on file  Intimate Partner Violence: Unknown (12/21/2021)   [facility]   HITS    Physically Hurt: Not on file    Insult or Talk Down To: Not on file    Threaten Physical Harm: Not on file    Scream or Curse: Not on file    Past Medical History, Surgical history, Social history, and Family history were reviewed and updated as appropriate.   Please see review of systems for further details on the patient's review from today.   Objective:   Physical Exam:  LMP 02/26/2011   Physical Exam Constitutional:      General: She is not in acute distress.    Appearance: Normal appearance. She is well-developed.  Musculoskeletal:        General: No deformity.  Neurological:     Mental Status: She is alert and oriented to person, place, and time.     Motor: No tremor.     Coordination: Coordination normal.     Gait: Gait normal.  Psychiatric:        Attention and Perception: Attention normal. She does not perceive auditory hallucinations.        Mood and Affect: Mood is anxious. Mood is not depressed. Affect is not labile, blunt, angry or inappropriate.        Speech: Speech normal. Speech is not rapid and pressured or slurred.        Behavior: Behavior normal.        Thought Content: Thought content normal. Thought content is not delusional. Thought content does not include homicidal or suicidal ideation. Thought content does not include suicidal plan.        Cognition and  Memory: Cognition normal.        Judgment: Judgment normal.     Comments: Insight intact. No auditory or visual hallucinations. No delusions.  Less dep and anxious ok now but ongoing stress.      Lab Review:  No results found for: "NA", "K", "CL", "CO2", "GLUCOSE", "BUN", "CREATININE", "CALCIUM", "PROT", "ALBUMIN", "AST", "ALT", "ALKPHOS", "BILITOT", "GFRNONAA", "GFRAA"     Component Value Date/Time   WBC 5.1 06/07/2011 0918   RBC 4.22 06/07/2011 0918   HGB 13.7 06/07/2011 0918   HCT 40.2 06/07/2011 0918   PLT 173 06/07/2011 0918   MCV 95.3 06/07/2011 0918   MCH 32.5 06/07/2011 0918   MCHC 34.1 06/07/2011 0918   RDW 12.8 06/07/2011 0918    No results found for: "POCLITH", "LITHIUM"   No results found for: "PHENYTOIN", "PHENOBARB", "VALPROATE", "CBMZ"   Normal recent vitamin D levels  .res Assessment: Plan:    Bracha "Marliss Czar" was seen today for follow-up, depression, anxiety and sleeping problem.  Diagnoses and all orders for this visit:  Bipolar 2 disorder (HCC) -     buPROPion (WELLBUTRIN XL) 150 MG 24 hr tablet; Take 1 tablet (150 mg total) by mouth daily. -     lamoTRIgine (LAMICTAL) 100 MG tablet; Take 1 tablet (100 mg total) by mouth 2 (two) times daily.  Panic  disorder with agoraphobia -     LORazepam (ATIVAN) 1 MG tablet; 1 to 1 and 1/2 tablets at night as needed for sleep  Generalized anxiety disorder -     LORazepam (ATIVAN) 1 MG tablet; 1 to 1 and 1/2 tablets at night as needed for sleep  Insomnia due to mental condition -     LORazepam (ATIVAN) 1 MG tablet; 1 to 1 and 1/2 tablets at night as needed for sleep  Low vitamin D level      She had a relapse of panic disorder back in the winter 2020. Also a little seasonal depression X mas 2022 in part DT loss of mother a couple of years ago. Situational depression and anxiety The propranolol is helpful for the anxiety and palpitations and tremor and managed by neuro..  Overall she is done reasonably  well on this medication combination since 2010.  She has a long psychiatric history dating back to 67.  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   We discussed the short-term risks associated with benzodiazepines including sedation and increased fall risk among others.  Discussed long-term side effect risk including dependence, potential withdrawal symptoms, and the potential eventual dose-related risk of dementia.  But recent studies from 2020 dispute this association between benzodiazepines and dementia risk. Newer studies in 2020 do not support an association with dementia.  Less daytimed drowsiness with lorazepam than alprazolam.  But working ok.  Not great.Marland Kitchen Options for sleep disc.  Not interested in Ambien .  Other option trazodone.  Take vitamin D increase to 5000 U daily through the winter off label for seasonal depression  Option change Wellbutrin to [employer].  Reduced back to  Wellbutrin XL 150 DT wired on 300 mg daily lamotrigine 100 mg BID Ativan 1.0 mg HS for sleep  Sleep hygiene with initial ins for a couple of hours but sleeps late to make up.  Occ nights without sleep thinking of demands. Caffeine restriction. Don't oversleep bc tends to get up late contributes to initial insomnia.  Option Ambien  Vitamin D levels now normal.  Disc tx of essential tremor.  She's tried propranolol and primidone  FU 4-6 mos  Meredith Staggers, MD, DFAPA   Please see After Visit Summary for patient specific instructions.  Future Appointments  Date Time Provider Department Center  11/19/2023  1:00 PM Gaspar Bidding [facility] None  12/20/2023  1:00 PM Gaspar Bidding, [facility] None      No orders of the defined types were placed in this encounter.     -------------------------------

## 2023-11-19 ENCOUNTER — Ambulatory Visit: Payer: 59 | Admitting: Professional Counselor

## 2023-12-20 ENCOUNTER — Ambulatory Visit: Payer: 59 | Admitting: Professional Counselor

## 2023-12-20 ENCOUNTER — Encounter: Payer: Self-pay | Admitting: Professional Counselor

## 2023-12-20 DIAGNOSIS — F3181 Bipolar II disorder: Secondary | ICD-10-CM

## 2023-12-20 NOTE — Progress Notes (Signed)
      [facility] Counselor/Therapist Progress Note  Patient ID: Debra Coleman, MRN: [id number],    Date: 12/20/2023  Time Spent: 1:32 PM to 2:08 PM  Treatment Type: Individual Therapy  Reported Symptoms: Memory concerns, impulsivity concerns, self-esteem concerns, low mood, low motivation  Mental Status Exam:  Appearance:   Casual     Behavior:  Appropriate, Sharing, and Motivated  Motor:  Normal  Speech/Language:   Clear and Coherent and Normal Rate  Affect:  Appropriate and Congruent  Mood:  normal  Thought process:  normal  Thought content:    WNL  Sensory/Perceptual disturbances:    WNL  Orientation:  oriented to person, place, time/date, and situation  Attention:  Good  Concentration:  Good  Memory:  WNL  Fund of knowledge:   Good  Insight:    Good  Judgment:   Good  Impulse Control:  Fair   Risk Assessment: Danger to Self:  No Self-injurious Behavior: No Danger to Others: No Duty to Warn:no Physical Aggression / Violence:No  Access to Firearms a concern: No  Gang Involvement:No   Subjective: Patient presented to session to address concerns of bipolar.  She reported mixed progress at this time.  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  She processed experience of caregiving for her father and this is a barrier to work outside of the home.  She also reported her essential tremors as compromising her ability to type at prior level.  Counselor actively listened, affirmed patient progress, feelings, and experience, facilitated MI around patient report of and concern regarding impulsive spending/shopping, and helped patient identify harm reduction strategies.  Interventions: Solution-Oriented/Positive Psychology, Humanistic/Existential, and Insight-Oriented, MI  Diagnosis:   ICD-10-CM   1. Bipolar 2 disorder (HCC)  F31.81       Plan: Patient is scheduled for follow-up; continue process work  and developing coping skills.  Patient short-term personal goal between sessions to practice strategies to limit impulsive spending/shopping, such as setting timer and utilizing mindfulness and self-awareness, and reset timer as needed, while utilizing self-soothing coping skills.  Progress note was dictated with Dragon and reviewed for accuracy.  Anthon Kins, [facility]

## 2024-01-21 ENCOUNTER — Ambulatory Visit: Admitting: Professional Counselor

## 2024-01-21 ENCOUNTER — Encounter: Payer: Self-pay | Admitting: Professional Counselor

## 2024-01-21 DIAGNOSIS — F3181 Bipolar II disorder: Secondary | ICD-10-CM | POA: Diagnosis not present

## 2024-01-21 DIAGNOSIS — F411 Generalized anxiety disorder: Secondary | ICD-10-CM

## 2024-01-21 NOTE — Progress Notes (Signed)
      [facility] Counselor/Therapist Progress Note  Patient ID: Debra Coleman, MRN: [id number],    Date: 01/21/2024  Time Spent: 1:15 PM to 2:12 PM  Treatment Type: Individual Therapy  Reported Symptoms: Worries, preoccupying thoughts, stress, intermittent low mood, anhedonia, low motivation  Mental Status Exam:  Appearance:   Casual     Behavior:  Appropriate and Sharing  Motor:  Normal  Speech/Language:   Clear and Coherent and Normal Rate  Affect:  Appropriate and Congruent  Mood:  normal  Thought process:  normal  Thought content:    WNL  Sensory/Perceptual disturbances:    WNL  Orientation:  oriented to person, place, time/date, and situation  Attention:  Good  Concentration:  Good  Memory:  WNL  Fund of knowledge:   Good  Insight:    Good  Judgment:   Good  Impulse Control:  Good   Risk Assessment: Danger to Self:  No Self-injurious Behavior: No Danger to Others: No Duty to Warn:no Physical Aggression / Violence:No  Access to Firearms a concern: No  Gang Involvement:No   Subjective: Patient presented to session to address concerns of bipolar including depression at this time, and anxiety.  She reported mixed progress at this time.  She reported impulsive spending to have been improved recently.  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  Counselor affirmed patient progress is.  Patient voiced considerable worry for her son as he is struggling with grief and loss experiences, phase of life concerns, and other matters.  Counselor actively listened and encouraged patient and provided recommendations for support.  Patient also processed concerns regarding contributions on behalf of adult children to family system finances, and counselor assisted with strategies around approach and regarding content to difficult conversation for patient.  Interventions:  Solution-Oriented/Positive Psychology, Humanistic/Existential, Insight-Oriented, and Referrals, Resourcing  Diagnosis:   ICD-10-CM   1. Bipolar 2 disorder (HCC)  F31.81     2. Generalized anxiety disorder  F41.1       Plan: Patient is scheduled for follow-up; continue process work and developing coping skills.  Patient short-term goal between sessions to follow-up with contract job she has pending, to initiate difficult conversation with adult children, and work on referrals for son for supportive assistance.  Progress note was dictated with Dragon and reviewed for accuracy.  Anthon Kins, [facility]

## 2024-03-19 ENCOUNTER — Ambulatory Visit: Payer: Self-pay

## 2024-03-31 ENCOUNTER — Ambulatory Visit: Payer: 59 | Admitting: Psychiatry

## 2024-03-31 ENCOUNTER — Ambulatory Visit: Admitting: Professional Counselor

## 2024-05-06 ENCOUNTER — Ambulatory Visit: Admitting: Psychiatry

## 2024-05-07 ENCOUNTER — Encounter: Payer: Self-pay | Admitting: Psychiatry

## 2024-05-07 ENCOUNTER — Ambulatory Visit (INDEPENDENT_AMBULATORY_CARE_PROVIDER_SITE_OTHER): Admitting: Psychiatry

## 2024-05-07 ENCOUNTER — Other Ambulatory Visit: Payer: Self-pay | Admitting: Psychiatry

## 2024-05-07 DIAGNOSIS — F3181 Bipolar II disorder: Secondary | ICD-10-CM

## 2024-05-07 DIAGNOSIS — F5105 Insomnia due to other mental disorder: Secondary | ICD-10-CM

## 2024-05-07 DIAGNOSIS — F4001 Agoraphobia with panic disorder: Secondary | ICD-10-CM | POA: Diagnosis not present

## 2024-05-07 DIAGNOSIS — F411 Generalized anxiety disorder: Secondary | ICD-10-CM

## 2024-05-07 MED ORDER — LORAZEPAM 1 MG PO TABS
ORAL_TABLET | ORAL | 5 refills | Status: DC
Start: 2024-05-07 — End: 2024-06-04

## 2024-05-07 MED ORDER — BUPROPION HCL ER (XL) 150 MG PO TB24
150.0000 mg | ORAL_TABLET | Freq: Every day | ORAL | 1 refills | Status: AC
Start: 2024-05-07 — End: ?

## 2024-05-07 MED ORDER — LAMOTRIGINE 100 MG PO TABS
100.0000 mg | ORAL_TABLET | Freq: Two times a day (BID) | ORAL | 1 refills | Status: AC
Start: 2024-05-07 — End: ?

## 2024-05-07 NOTE — Progress Notes (Signed)
 Debra Coleman [id number] December 05, 1965 58 y.o.  Subjective:   Patient ID:  Debra Coleman is a 58 y.o. (DOB September 24, 1965) female.  Chief Complaint:  Chief Complaint  Patient presents with   Follow-up   Depression   Anxiety   Sleeping Problem    HPI   Debra Coleman presents to the office today for follow-up of mood disorder and worsening anxiety.  Went to ER [facility] September 24, 2018 with panic.  Workup was normal.  Seen September 25, 2018.  She had been having some panic and insomnia and alprazolam  was adjusted slightly.  It was suggested she take vitamin D through the winter for her history of seasonal depression.  No other med changes were made.  Been consistent with the meds.  seen March 10, 2019 & Jan 2021.  No meds were changed. Stayed on Welllbutrin 150, lamotrigine  200 mg daily.  05/18/20 appt with following noted: M died suddenly MI FEB and she was caretaker of F with dementia.  She had to move in to care for him and take leave from work. Then got a work from home job.  Increasing work demands and stress. Kids stayiing in their house. Some depression and anxiety is worse. Taking Xanax  0.25 mg at night again.  Can't take in day DT grogginess. Xanax  0.25 at sleep. Still taking propranolol  BID. A lot better.  Propranolol  helps the palpitations and split the dosage am and pm with good results.  No panic now. Plan no med changes except switched Xanax  to Ativan   10/14/2020 appt noted: Better overall.  Not taking Bz daytime but does at night.  Lorazepam  less effective for sleep than Xanax  but it's OK.   Still dealing with F with dementia. No SE. Bouts of reflux with GI work up.  More bouts of palpitations and lower energy. Plan no med changes  07/07/21 appt noted; Saw neuro for neuropathy and worsening tremors and increased to 40 mg daily. Taking Ativan  0.5 mg 2 tablets for sleep and doesn't work as good as Xanax  for sleep. Not working at the moment and wants to switch back to  Xanax .  Not as anxious in the day. No mood swings since here.  D is depressed now.   Patient reports stable mood and denies depressed or irritable moods.  Patient denies any recent difficulty with anxiety.  Patient denies difficulty with sleep initiation or maintenance. Sleep 8-9 hours straight through the night.  Denies appetite disturbance.  Patient reports that motivation have been good.  Patient denies any difficulty with concentration.  Patient denies any suicidal ideation. No sig seasonal depression.  Not crazy about job but function is good. Plan: No med changes except switch BZ back to Xanax  0.25 mg bc better for sleep  01/05/2022 appointment with the following noted: Switch from alprazolam  to lorazepam  bc hangover.  Doing ok with lorazepam  1mg  HS but some trouble falling to sleep.   Takes propranolol  now 60 mg per neuro for tremor.   Starts fitness program and diet. Not working bc caring for father.  He's about the same with dementia. Doing well overall.  Fitness program helped and lost 21#, no sugar and no dairy.   E2M program. Patient reports stable mood and denies depressed or irritable moods.  Patient denies any recent difficulty with anxiety.  Patient denies difficulty with sleep initiation or maintenance. Getting  7-8 hours of sleep.Denies appetite disturbance.  Patient reports that energy and motivation have been good.  Patient denies any difficulty  with concentration.  Patient denies any suicidal ideation. Plan: No med changes  Continue Wellbutrin  XL 150, lamotrigine  100 mg BID Switch to lorazepam  for less daytime sleepiness and use temporarily with worsening stress was successful but she's not using it daytime now. Ativan  0.25-0.5 mg BID prn anxiety and 0.5-1.0 mg HS for sleep  07/10/22 appt noted: Continues meds.   Doing pretty well without major mood or anxiety episodes.  Summer stressful situations.  Son's friend committed suicide and son having problems with depression anyway  and in counseling.  Gordy guilt and blaming himself.  Worries over her son. D also dealing with depression. Pt not sig depressed with brief down mild spells of a couple of weeks. No major anxiety Still trouble gettting to sleep before 130 A.  Not sure about lorazepam .  Can sleep later bc not working now.  Cares for F with demential.  Avg 7 hours of sleep. Not usually drowsy daytime. Plan: No med changes  Continue Wellbutrin  XL 150,  lamotrigine  100 mg BID Ativan  0.25-0.5 mg BID prn anxiety and 0.5-1.0 mg HS for sleep Supplement Vit D in winter  01/19/23 appt noted: Takes lorazepam  1.5 mg HS.  Don't sleep well and not sure it helps. Sometimes more anxious than others. More trouble with dep and anxiety this spring for a couple of mos.  No obvious trigger.  $ stress.  Worry over kids a little better.  Son dep but graduating next week.  Cares for F with dementia.  Hard  to get him to bathe. Tends to get stressed out any time takes a trip.  Had a crying period when went on the trip.   Not as motivated to do things.  Not enjoying things like she should.   Plan: Option change Wellbutrin  to [employer].  She prefers to increase Wellbutrin  Increase Wellbutrin  XL 300,  lamotrigine  100 mg BID  Ativan  0.25-0.5 mg BID prn anxiety and 0.5-1.0 mg HS for sleep  03/27/23 appt noted:' Increase Wellbutrin  300 for 4 weeks.  SE wired feeling and reduced back to 150 mg daily. It did help with motivation and enjoyment and has remained ok since reduced the dose. Close aunt passed.  Hard but handled it.   F has dementia.  She has caregiver fatigue.  Needs at least respite care. Not real sad but tired.   Keeping F at home but constant question and confusion.   Seeing neuro [facility]bra [facility] about tremor. Plan: Option change Wellbutrin  to [employer].  Reduce back to  Wellbutrin  XL 150 DT wired on 300 mg daily lamotrigine  100 mg BID  Ativan  0.25-0.5 mg BID prn anxiety and 0.5-1.0 mg HS for sleep  07/08/23 appt  noted: Covid 4 weeks ago. Recovered except cough. Psych meds: lorazepam  1-1.5 mg HS, lamotrigine  100 mg BID, Wellbutrin  XL 150 mg AM Less wired with less Wellbutrin ..   Sleep px 2 AM-1030 AM.  Not drowsy during the day. Rare occ of no sleep.   Uncle closest one, passed a week ago.  Stayed with him over his hospital stay. Wants to wean off lorazepam  bc minimal effect New job pPT No dep.  Anxiety manageable.  Mood pretty normal. Caring for F and looking into [place] benefit.s Plan: disc weaning lorazepam   11/05/23 appt noted: Psych meds: lorazepam  1-1.5 mg HS, lamotrigine  100 mg BID, Wellbutrin  XL 150 mg AM.  No SE Nothing better off lorazepam  for a couple of weeks and sleep was worse so restarted it.  Main px getting to sleep  until 130-200 am but can sleep late.  Split sleep cycle but usually 7-8 hours.   Cares for father FT. Anxiety ok.  A little down with holidays and winter and chronic $ problems.  Might be some light at end of tunnel. 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   Kids doing pretty well lately. Son grad college in Dec and looking for a job. No sig med concerns.   She and H losing their dog. Plan; Option change Wellbutrin  to [employer].  Reduced back to  Wellbutrin  XL 150 DT wired on 300 mg daily lamotrigine  100 mg BID Ativan  1.0 mg HS for sleep  05/07/24 appt noted:  Med: lorazepam  1-1.5 mg HS, lamotrigine  100 mg BID, Wellbutrin  XL 150 mg AM.  No SE Pretty well. Except D's dep worse last month after well for a couple of years.  Stressful. She's 58 yo and struggled since 58 yo. Some down times but not bad with less motivation better now.   Crazy puppy in May Stays in house with 22 yo dad.  And can't leave puppy with him.   Frozen shoulder painful.  Limits her.   Sleep pretty well with some initial ins helped by mg. Propranolol  limited benefit with tremor.   Past Psychiatric Medication Trials:  Propranolol  20 mg twice daily,  Wellbutrin  XL 300 SE wired,   sertraline, duloxetine, paroxetine, venlafaxine worsened anxiety,  lamotrigine  200,   Abilify,  lithium weight gain,  Xanax  hangover, lorazepam   Son & D tx depression.  Review of Systems:  Review of Systems  Cardiovascular:  Negative for chest pain and palpitations.  Gastrointestinal:  Positive for constipation.  Musculoskeletal:  Positive for arthralgias.  Neurological:  Negative for dizziness and tremors.  Psychiatric/Behavioral:  Positive for sleep disturbance. Negative for agitation, behavioral problems, confusion, decreased concentration, dysphoric mood, hallucinations, self-injury and suicidal ideas. The patient is nervous/anxious. The patient is not hyperactive.     Medications: I have reviewed the patient's current medications.  Current Outpatient Medications  Medication Sig Dispense Refill   B Complex-Folic Acid (B COMPLEX-VITAMIN B12 PO) Take 1 tablet by mouth daily.     buPROPion  (WELLBUTRIN  XL) 150 MG 24 hr tablet Take 1 tablet (150 mg total) by mouth daily. 90 tablet 1   Cholecalciferol (VITAMIN D-3 PO) Take 2 tablets by mouth daily.     lamoTRIgine  (LAMICTAL ) 100 MG tablet Take 1 tablet (100 mg total) by mouth 2 (two) times daily. 180 tablet 1   LORazepam  (ATIVAN ) 1 MG tablet 1 to 1 and 1/2 tablets at night as needed for sleep 45 tablet 5   omeprazole  (PRILOSEC) 40 MG capsule Take 1 capsule (40 mg total) by mouth daily. 30 capsule 0   propranolol  ER (INDERAL  LA) 60 MG 24 hr capsule Take 60 mg by mouth daily.     valACYclovir (VALTREX) 500 MG tablet Take 500 mg by mouth daily.      No current facility-administered medications for this visit.    Medication Side Effects: None  Allergies: No Known Allergies  Past Medical History:  Diagnosis Date   Anxiety    Depression    takes wellbutrin , lamictal    GERD (gastroesophageal reflux disease)    no meds    Family History  Adopted: Yes    Social History   Socioeconomic History   Marital status: Married     Spouse name: Not on file   Number of children: Not on file   Years of education: Not on file  Highest education level: Not on file  Occupational History   Not on file  Tobacco Use   Smoking status: Never   Smokeless tobacco: Never  Substance and Sexual Activity   Alcohol use: Not Currently   Drug use: No   Sexual activity: Yes  Other Topics Concern   Not on file  Social History Narrative   Not on file   Social Drivers of Health   Financial Resource Strain: Not on file  Food Insecurity: Low Risk  (11/12/2023)   Received from [facility]   Hunger Vital Sign    Within the past 12 months, you worried that your food would run out before you got money to buy more: Never true    Within the past 12 months, the food you bought just didn't last and you didn't have money to get more. : Never true  Transportation Needs: No Transportation Needs (11/12/2023)   Received from [facility]   Transportation    In the past 12 months, has lack of reliable transportation kept you from medical appointments, meetings, work or from getting things needed for daily living? : No  Physical Activity: Not on file  Stress: Not on file  Social Connections: Unknown (01/29/2022)   Received from [facility]   Social Network    Social Network: Not on file  Intimate Partner Violence: Unknown (12/21/2021)   Received from [facility]   HITS    Physically Hurt: Not on file    Insult or Talk Down To: Not on file    Threaten Physical Harm: Not on file    Scream or Curse: Not on file    Past Medical History, Surgical history, Social history, and Family history were reviewed and updated as appropriate.   Please see review of systems for further details on the patient's review from today.   Objective:   Physical Exam:  LMP 02/26/2011   Physical Exam Constitutional:      General: She is not in acute distress.    Appearance: Normal appearance. She is well-developed.  Musculoskeletal:        General:  No deformity.  Neurological:     Mental Status: She is alert and oriented to person, place, and time.     Motor: Tremor present.     Coordination: Coordination normal.     Gait: Gait normal.     Comments: Tremor worse on left.  Seen neuro  Psychiatric:        Attention and Perception: Attention normal. She does not perceive auditory hallucinations.        Mood and Affect: Mood is anxious. Mood is not depressed. Affect is not labile, blunt, angry or inappropriate.        Speech: Speech normal. Speech is not rapid and pressured or slurred.        Behavior: Behavior normal.        Thought Content: Thought content normal. Thought content is not delusional. Thought content does not include homicidal or suicidal ideation. Thought content does not include suicidal plan.        Cognition and Memory: Cognition normal.        Judgment: Judgment normal.     Comments: Insight intact. No auditory or visual hallucinations. No delusions.  Less dep and anxious ok now but ongoing stress. Mild sx at present.      Lab Review:  No results found for: NA, K, CL, CO2, GLUCOSE, BUN, CREATININE, CALCIUM, PROT, ALBUMIN, AST, ALT, ALKPHOS, BILITOT, GFRNONAA, GFRAA  Component Value Date/Time   WBC 5.1 06/07/2011 0918   RBC 4.22 06/07/2011 0918   HGB 13.7 06/07/2011 0918   HCT 40.2 06/07/2011 0918   PLT 173 06/07/2011 0918   MCV 95.3 06/07/2011 0918   MCH 32.5 06/07/2011 0918   MCHC 34.1 06/07/2011 0918   RDW 12.8 06/07/2011 0918    No results found for: POCLITH, LITHIUM   No results found for: PHENYTOIN, PHENOBARB, VALPROATE, CBMZ   Normal recent vitamin D levels  .res Assessment: Plan:    Masayo Fera was seen today for follow-up, depression, anxiety and sleeping problem.  Diagnoses and all orders for this visit:  Bipolar 2 disorder (HCC)  Generalized anxiety disorder  Panic disorder with agoraphobia  Insomnia due to mental  condition   She had a relapse of panic disorder back in the winter 2020. Also a little seasonal depression X mas 2022 in part DT loss of mother a couple of years ago.   Caring for 59 yo father with poor STM.   More confused at night. Situational depression and anxiety The propranolol  is helpful for the anxiety and palpitations and tremor and managed by neuro..  Overall she is done reasonably well on this medication combination since 2010.  She has a long psychiatric history dating back to 39.  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   We discussed the short-term risks associated with benzodiazepines including sedation and increased fall risk among others.  Discussed long-term side effect risk including dependence, potential withdrawal symptoms, and the potential eventual dose-related risk of dementia.  But recent studies from 2020 dispute this association between benzodiazepines and dementia risk. Newer studies in 2020 do not support an association with dementia.  Less daytimed drowsiness with lorazepam  than alprazolam .  But working ok.  Not great.SABRA Options for sleep disc.  Not interested in Ambien .  Other option trazodone.  Option change Wellbutrin  to [employer].  Reduced back to  Wellbutrin  XL 150 DT wired on 300 mg daily lamotrigine  100 mg BID Ativan  1.0 mg HS for sleep  Sleep hygiene with initial ins for a couple of hours but sleeps late to make up.  Occ nights without sleep thinking of demands. Caffeine restriction. Don't oversleep bc tends to get up late contributes to initial insomnia.  Option Ambien  Vitamin D levels now normal.  She takes all year long.  Take vitamin D increase to 5000 U daily through the winter off label for seasonal depression  Disc tx of essential tremor.  She's tried propranolol  and primidone; latter worked but Geophysical data processor.   FU 4-6 mos  Lorene Macintosh, MD,  DFAPA   Please see After Visit Summary for patient specific instructions.  Future Appointments  Date Time Provider Department Center  05/29/2024  2:00 PM Burnetta Almarie DASEN, [facility] None      No orders of the defined types were placed in this encounter.     -------------------------------

## 2024-05-26 ENCOUNTER — Telehealth: Payer: Self-pay | Admitting: Psychiatry

## 2024-05-26 NOTE — Telephone Encounter (Signed)
 LVM to [facility] to clarify message.

## 2024-05-26 NOTE — Telephone Encounter (Signed)
 Next visit is 11/10/24. Avamae called about her Disfenlafaxine 50 mg #60. She states [employer] will not pay the 50 mg twice a day but will pay 100 mg once a day instead. Phone number is [phone]

## 2024-05-26 NOTE — Telephone Encounter (Signed)
 This message is for her dtr, not her.

## 2024-05-29 ENCOUNTER — Encounter: Payer: Self-pay | Admitting: Professional Counselor

## 2024-05-29 ENCOUNTER — Ambulatory Visit: Admitting: Professional Counselor

## 2024-05-29 DIAGNOSIS — F411 Generalized anxiety disorder: Secondary | ICD-10-CM | POA: Diagnosis not present

## 2024-05-29 DIAGNOSIS — F3181 Bipolar II disorder: Secondary | ICD-10-CM

## 2024-05-29 NOTE — Progress Notes (Signed)
      [facility] Counselor/Therapist Progress Note  Patient ID: Debra Coleman, MRN: [id number],    Date: 05/29/2024  Time Spent: 2:15 PM to 3:10 PM  Treatment Type: Individual Therapy  Reported Symptoms: Low motivation, ruminations, scattered thinking, stress, anhedonia, low mood, fatigue, appetite concerns, self-esteem concerns, trouble concentrating, lethargy, nervousness, anxiousness, worries, trouble relaxing, irritability, catastrophic thinking, phase of life concerns, health concerns, parenting concerns  Mental Status Exam:  Appearance:   Casual     Behavior:  Appropriate and Sharing  Motor:  Tremor  Speech/Language:   Clear and Coherent and Normal Rate  Affect:  Appropriate and Congruent  Mood:  dysthymic  Thought process:  normal  Thought content:    WNL  Sensory/Perceptual disturbances:    WNL  Orientation:  oriented to person, place, time/date, and situation  Attention:  Good  Concentration:  Good  Memory:  WNL  Fund of knowledge:   Good  Insight:    Good  Judgment:   Good  Impulse Control:  Good   Risk Assessment: Danger to Self:  No Self-injurious Behavior: No Danger to Others: No Duty to Warn:no Physical Aggression / Violence:No  Access to Firearms a concern: No  Gang Involvement:No   Subjective: Patient presented to session to address concerns of bipolar disorder and anxiety.  She reported makes progress at this time.  Patient reflected on experience of bipolar, and reported lamotrigine  to have been helpful for years in stabilizing mood.  She reflected on experience of mania in the past, and voiced experience of depression to persist, and anxiety.  Counselor facilitated PHQ 9 and patient scored a 15, GAD-7 and patient scored a 12.  Counselor and patient discussed results.  Counselor and patient discussed patient updated treatment plan, and patient gave her consent.  She voiced not wanting a goal for mania given efficacy of medication and recent experience of  mania minimal.  Patient processed experience of recent cataract surgery, and worries regarding her adult children.  Counselor actively listened and helped patient with resources to support.  Patient identified new puppy as a joy but also stressful.  Counselor reinforced patient positive coping skills.  Interventions: Solution-Oriented/Positive Psychology, Humanistic/Existential, Insight-Oriented, and Treatment Planning  Diagnosis:   ICD-10-CM   1. Bipolar 2 disorder (HCC)  F31.81     2. Generalized anxiety disorder  F41.1       Plan: Patient is scheduled for follow-up; continue process work and developing coping skills.  STG between sessions for patient to continue to support adult children, while prioritizing her own self-care and resourcing of coping skills.  [name], [facility]

## 2024-06-04 ENCOUNTER — Other Ambulatory Visit: Payer: Self-pay | Admitting: Psychiatry

## 2024-06-04 DIAGNOSIS — F411 Generalized anxiety disorder: Secondary | ICD-10-CM

## 2024-06-04 DIAGNOSIS — F4001 Agoraphobia with panic disorder: Secondary | ICD-10-CM

## 2024-06-04 DIAGNOSIS — F5105 Insomnia due to other mental disorder: Secondary | ICD-10-CM

## 2024-08-07 ENCOUNTER — Ambulatory Visit: Admitting: Professional Counselor

## 2024-08-07 ENCOUNTER — Encounter: Payer: Self-pay | Admitting: Professional Counselor

## 2024-08-07 DIAGNOSIS — F3181 Bipolar II disorder: Secondary | ICD-10-CM | POA: Diagnosis not present

## 2024-08-07 DIAGNOSIS — F411 Generalized anxiety disorder: Secondary | ICD-10-CM

## 2024-08-07 NOTE — Progress Notes (Addendum)
°      [facility] Counselor/Therapist Progress Note  Patient ID: Debra Coleman, MRN: [id number],    Date: 08/07/2024  Time Spent: 4:08 PM to 5:06 PM  Treatment Type: Individual Therapy  Reported Symptoms: Sadness, worries, stress, parenting concerns, phase of life concerns, anxiousness, interpersonal concerns, anhedonia, motivational concerns, fatigue  Mental Status Exam:  Appearance:   Casual     Behavior:  Appropriate, Sharing, and Motivated  Motor:  Normal  Speech/Language:   Clear and Coherent and Normal Rate  Affect:  Appropriate and Congruent, tearful  Mood:  normal  Thought process:  normal  Thought content:    WNL  Sensory/Perceptual disturbances:    WNL  Orientation:  oriented to person, place, time/date, and situation  Attention:  Good  Concentration:  Good  Memory:  WNL  Fund of knowledge:   Good  Insight:    Good  Judgment:   Good  Impulse Control:  Good   Risk Assessment: Danger to Self:  No Self-injurious Behavior: No Danger to Others: No Duty to Warn:no Physical Aggression / Violence:No  Access to Firearms a concern: No  Gang Involvement:No   Subjective: Patient presented to session to address concerns of anxiety and bipolar disorder.  Patient reported progress at this time.  She reported progress in her daughters spiritual and personal life and career path, which patient identified as helpful in stress and symptom mitigation.  She also processed experience of her son entering therapeutic program in the coming days which she identified as a relief at this time, given her ongoing worry for him.  Counselor actively listened, affirmed patient feelings and experience, and helped to facilitate insight and instill hope.  Patient identified not pursuing work at present, focusing on family and her wellness.  Counselor reinforced patient self-care.  Patient identified feeling better recently, mostly preoccupied with husband's relationship with her children; counselor  and patient discussed possibility of couples counseling.  Counselor and patient discussed patient renewed treatment plan, and patient gave her consent.  Interventions: Solution-Oriented/Positive Psychology, Humanistic/Existential, and Insight-Oriented  Diagnosis:   ICD-10-CM   1. Generalized anxiety disorder  F41.1     2. Bipolar 2 disorder (HCC)  F31.81       Plan: Patient is scheduled for follow-up; continue process work and developing coping skills.  Patient short-term goal between sessions to continue to prioritize self-care, and utilize coping skills for stress management as she faces challenging family circumstances.  [name], [facility]

## 2024-09-04 ENCOUNTER — Ambulatory Visit: Admitting: Professional Counselor

## 2024-10-18 ENCOUNTER — Ambulatory Visit: Payer: Self-pay

## 2024-10-22 ENCOUNTER — Ambulatory Visit: Admitting: Professional Counselor

## 2024-10-22 ENCOUNTER — Encounter: Payer: Self-pay | Admitting: Professional Counselor

## 2024-10-22 DIAGNOSIS — F411 Generalized anxiety disorder: Secondary | ICD-10-CM

## 2024-10-22 DIAGNOSIS — F3181 Bipolar II disorder: Secondary | ICD-10-CM

## 2024-10-22 NOTE — Progress Notes (Unsigned)
"   °      [facility] Counselor/Therapist Progress Note  Patient ID: Debra Coleman, MRN: [id number],    Date: 10/22/2024  Time Spent: ***   Treatment Type: Individual Therapy  Reported Symptoms: sadness, low mood, worries, anhedonia, fatigue, stress, caregiver strain, trouble concentrating, parenting concerns, phase of life concerns, anxiousness, restlessness, irritability, increased appetite, low sleep, self esteem concerns  Mental Status Exam:  Appearance:   Casual     Behavior:  Appropriate and Sharing  Motor:  Normal  Speech/Language:   Clear and Coherent and Normal Rate  Affect:  Appropriate and Congruent  Mood:  normal  Thought process:  normal  Thought content:    WNL  Sensory/Perceptual disturbances:    WNL  Orientation:  oriented to person, place, time/date, and situation  Attention:  Good  Concentration:  Good  Memory:  WNL  Fund of knowledge:   Good  Insight:    Good  Judgment:   Good  Impulse Control:  Good   Risk Assessment: Danger to Self:  No Self-injurious Behavior: No Danger to Others: No Duty to Warn:no Physical Aggression / Violence:No  Access to Firearms a concern: No  Gang Involvement:No   Subjective: ***   Interventions: Solution-Oriented/Positive Psychology, Humanistic/Existential, and Insight-Oriented  Diagnosis:   ICD-10-CM   1. Bipolar 2 disorder (HCC)  F31.81     2. Generalized anxiety disorder  F41.1       Plan: ***  Debra Coleman, [facility]                   "

## 2024-11-10 ENCOUNTER — Ambulatory Visit: Admitting: Psychiatry

## 2024-11-18 ENCOUNTER — Ambulatory Visit: Admitting: Professional Counselor

## 2025-01-05 ENCOUNTER — Ambulatory Visit: Admitting: Professional Counselor

## 2025-02-04 ENCOUNTER — Ambulatory Visit: Admitting: Professional Counselor
